# Patient Record
Sex: Female | Born: 1945 | Race: White | Hispanic: No | Smoking: Current every day smoker
Health system: Southern US, Community
[De-identification: ages and names within clinical notes are randomized; demographics above are authoritative.]

## PROBLEM LIST (undated history)

## (undated) DIAGNOSIS — J45909 Unspecified asthma, uncomplicated: Secondary | ICD-10-CM

## (undated) DIAGNOSIS — K5792 Diverticulitis of intestine, part unspecified, without perforation or abscess without bleeding: Secondary | ICD-10-CM

## (undated) DIAGNOSIS — E039 Hypothyroidism, unspecified: Secondary | ICD-10-CM

## (undated) DIAGNOSIS — R519 Headache, unspecified: Secondary | ICD-10-CM

## (undated) DIAGNOSIS — F329 Major depressive disorder, single episode, unspecified: Secondary | ICD-10-CM

## (undated) DIAGNOSIS — M1990 Unspecified osteoarthritis, unspecified site: Secondary | ICD-10-CM

## (undated) DIAGNOSIS — C801 Malignant (primary) neoplasm, unspecified: Secondary | ICD-10-CM

## (undated) DIAGNOSIS — R51 Headache: Secondary | ICD-10-CM

## (undated) DIAGNOSIS — I10 Essential (primary) hypertension: Secondary | ICD-10-CM

## (undated) DIAGNOSIS — F32A Depression, unspecified: Secondary | ICD-10-CM

## (undated) HISTORY — PX: TONSILLECTOMY: SUR1361

---

## 1999-04-19 ENCOUNTER — Other Ambulatory Visit: Admission: RE | Admit: 1999-04-19 | Discharge: 1999-04-19 | Payer: Self-pay | Admitting: *Deleted

## 2000-07-01 ENCOUNTER — Other Ambulatory Visit: Admission: RE | Admit: 2000-07-01 | Discharge: 2000-07-01 | Payer: Self-pay | Admitting: *Deleted

## 2001-07-02 ENCOUNTER — Other Ambulatory Visit: Admission: RE | Admit: 2001-07-02 | Discharge: 2001-07-02 | Payer: Self-pay | Admitting: *Deleted

## 2002-11-09 ENCOUNTER — Other Ambulatory Visit: Admission: RE | Admit: 2002-11-09 | Discharge: 2002-11-09 | Payer: Self-pay | Admitting: *Deleted

## 2004-01-17 ENCOUNTER — Other Ambulatory Visit: Admission: RE | Admit: 2004-01-17 | Discharge: 2004-01-17 | Payer: Self-pay | Admitting: *Deleted

## 2005-03-23 ENCOUNTER — Other Ambulatory Visit: Admission: RE | Admit: 2005-03-23 | Discharge: 2005-03-23 | Payer: Self-pay | Admitting: *Deleted

## 2005-08-03 ENCOUNTER — Ambulatory Visit (HOSPITAL_COMMUNITY): Admission: RE | Admit: 2005-08-03 | Discharge: 2005-08-03 | Payer: Self-pay | Admitting: Family Medicine

## 2005-08-03 ENCOUNTER — Inpatient Hospital Stay (HOSPITAL_COMMUNITY): Admission: EM | Admit: 2005-08-03 | Discharge: 2005-08-12 | Payer: Self-pay | Admitting: Emergency Medicine

## 2005-08-13 HISTORY — PX: ABDOMINAL PERINEAL BOWEL RESECTION: SHX1111

## 2005-08-14 ENCOUNTER — Ambulatory Visit (HOSPITAL_COMMUNITY): Admission: RE | Admit: 2005-08-14 | Discharge: 2005-08-14 | Payer: Self-pay | Admitting: *Deleted

## 2005-08-24 ENCOUNTER — Encounter: Admission: RE | Admit: 2005-08-24 | Discharge: 2005-08-24 | Payer: Self-pay

## 2005-11-15 ENCOUNTER — Ambulatory Visit (HOSPITAL_COMMUNITY): Admission: RE | Admit: 2005-11-15 | Discharge: 2005-11-15 | Payer: Self-pay

## 2006-06-04 ENCOUNTER — Encounter: Admission: RE | Admit: 2006-06-04 | Discharge: 2006-06-04 | Payer: Self-pay

## 2006-06-14 ENCOUNTER — Inpatient Hospital Stay (HOSPITAL_COMMUNITY): Admission: RE | Admit: 2006-06-14 | Discharge: 2006-06-19 | Payer: Self-pay

## 2006-06-14 ENCOUNTER — Encounter (INDEPENDENT_AMBULATORY_CARE_PROVIDER_SITE_OTHER): Payer: Self-pay | Admitting: Specialist

## 2006-08-13 HISTORY — PX: INCISIONAL HERNIA REPAIR: SHX193

## 2006-12-09 ENCOUNTER — Other Ambulatory Visit: Admission: RE | Admit: 2006-12-09 | Discharge: 2006-12-09 | Payer: Self-pay | Admitting: Obstetrics and Gynecology

## 2007-12-10 ENCOUNTER — Other Ambulatory Visit: Admission: RE | Admit: 2007-12-10 | Discharge: 2007-12-10 | Payer: Self-pay | Admitting: Obstetrics and Gynecology

## 2008-03-05 ENCOUNTER — Inpatient Hospital Stay (HOSPITAL_COMMUNITY): Admission: RE | Admit: 2008-03-05 | Discharge: 2008-03-07 | Payer: Self-pay | Admitting: Orthopedic Surgery

## 2009-08-25 ENCOUNTER — Other Ambulatory Visit: Admission: RE | Admit: 2009-08-25 | Discharge: 2009-08-25 | Payer: Self-pay | Admitting: Family Medicine

## 2010-08-13 DIAGNOSIS — C801 Malignant (primary) neoplasm, unspecified: Secondary | ICD-10-CM

## 2010-08-13 HISTORY — DX: Malignant (primary) neoplasm, unspecified: C80.1

## 2010-12-26 NOTE — Op Note (Signed)
NAME:  Darlene Sanders, Darlene Sanders                ACCOUNT NO.:  [id number]   MEDICAL RECORD NO.:  [id number]          PATIENT TYPE:  INP   LOCATION:  0004                         FACILITY:  [facility]   PHYSICIAN:  Wilmon Arms. Corliss Skains, M.D. DATE OF BIRTH:  [birth year]   DATE OF PROCEDURE:  03/05/2008  DATE OF DISCHARGE:                               OPERATIVE REPORT   PREOPERATIVE DIAGNOSIS:  Ventral incisional hernia   POSTOPERATIVE DIAGNOSIS:  Ventral incisional hernias x3.   SURGEON:  Wilmon Arms. Corliss Skains, MD, FACS   ANESTHESIA:  General.   INDICATIONS:  The patient is a 65 year old female who presents with  clinical bulge at the upper end of her midline incision.  She is status  post a sigmoid colectomy for diverticulitis in 2007 by Dr. Marcy Panning.  She developed a bulge within the first year, and this has enlarged and  become uncomfortable.  She denies any obstructive symptoms.  She  presents now for surgical repair.   DESCRIPTION OF PROCEDURE:  The patient was brought to the operating  room, placed in a supine position on the operating room table.  After an  adequate level of general anesthesia was obtained, a Foley catheter was  placed under sterile technique.  Graft was prepped with Betadine and  draped in sterile fashion.  A time-out was taken to ensure the proper  patient and proper procedure.  An 11 mm incision was made in the left  upper quadrant just below the costal margin.  The Fios trocar was used  to cannulate the peritoneal cavity and insufflate CO2 maintaining a  maximum pressure of 15 mmHg.  The trocar was advanced into the  peritoneal cavity, and we noticed a lot of omental adhesions as well as  some bowel heading up into an upper midline hernia defect.  There were  some flimsy adhesions down inside of the pelvis.  Two 5 mm ports placed  on the left side.  We took down some of these pelvic adhesions.  Then  with a combination of manual reduction and traction as well as judicious  use  of cautery, we were able to reduce all the contents out of the  hernia sac.  We actually identified 2 hernia sacs which were immediately  adjacent to each other in the epigastrium.  This upper hernia defect  extended right up to the edge of the falciform ligament.  We took the  falciform ligament back with cautery for several centimeters.  We then  examined the remainder of the abdominal wall.  Another hernia defect was  noted at the umbilicus.  No contents were noted in this hernia sac.  The  umbilical hernia defect measured about 3 cm.  The accumulative diameter  of the 2 epigastric hernias was about 5 cm.  However, when I measured  the ends of each hernia defect and added at least 3 cm in all directions  for mesh overlap, we needed a 25 cm long piece of mesh.  I took a 20 x  25 cm piece of mesh and cut into a 50 x 25 cm piece.  This was rolled up  after placing 6 stay sutures of 0 Prolene.  We inserted it into the  peritoneal cavity, unfurled it.  The stay sutures were pulled up through  the abdominal wall with the Endoclose device.  We tied down the stay  sutures.  A Protacs were then used to tack down the edges of the mesh.  This provided good coverage circumferentially for all the hernia  defects.  No bleeding was noted.  The mesh did not appear to be under  any undue tension.  We did place two 5 mm right-sided trocars to provide  access for a circumferential Protex.  We then removed the 11 mm Fios  trocar and closed the fascial defect with a 0 Vicryl suture.  Pneumoperitoneum was then  released as we removed the remainder of the trocars.  The 11 mm incision  was closed with a couple of deep 3-0 Vicryls.  Dermabond was used to  close the skin.  Foley catheter was removed.  The patient was then  extubated and brought to recovery in stable condition.  All sponge,  instrument, and needle counts were correct.      Wilmon Arms. Tsuei, M.D.  Electronically Signed     MKT/MEDQ  D:   03/05/2008  T:  03/05/2008  Job:  [id number]
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
## 2010-12-29 NOTE — Discharge Summary (Signed)
Darlene Sanders, Darlene Sanders                ACCOUNT NO.:  [id number]   MEDICAL RECORD NO.:  [id number]          PATIENT TYPE:  INP   LOCATION:  1616                         FACILITY:  [facility]   PHYSICIAN:  Lebron Conners, M.D.   DATE OF BIRTH:  [birth year]   DATE OF ADMISSION:  08/03/2005  DATE OF DISCHARGE:  08/12/2005                                 DISCHARGE SUMMARY   HISTORY OF PRESENT ILLNESS:  A 65 year old, healthy, white female with lower  abdominal pain for about 2 weeks.  On the day of admission, she was seen by  Dr. Gweneth Dimitri who obtained a CT scan of the abdomen with findings of  acute diverticulitis of the sigmoid colon with an abscess. The patient had a  white count of 20,000 and a fever.  She was referred to me for care and I  admitted her to the hospital.  Pertinent points of the past medical history  are occasional symptoms with reflux, high blood pressure which is well-  controlled, penicillin causing a rash, fluorescein dye causing fainting,  nausea with codeine.   MEDICATIONS:  Lisinopril, Pravachol, Synthroid, Celexa.   PAST SURGICAL HISTORY:  She has had a tonsillectomy.   PHYSICAL EXAMINATION:  Physical exam was significant for tenderness in the  left lower quadrant of the abdomen.   HOSPITAL COURSE:  The patient was admitted and placed on broad-spectrum  antibiotics.  The white count, exam and vitals were followed.  On August 04, 2005, interventional radiology did a CT-guided drainage of left lower  quadrant diverticular abscess.  The patient slowly improved, but continued  to have some leukocytosis and fever.  The patient improved, but she  continued to have some difficulty with fever and tenderness in the left  lower quadrant.  A second abscess drain was placed and the previously placed  drain was removed and that resulted in resolution of symptoms, leukocytosis  and fever.  The patient was discharged with drain intact on August 12, 2005, with arrangements  made for followup CT scan and possible drainage  removal next week.   DISCHARGE MEDICATIONS:  She was sent home on Cipro and Flagyl for continued  oral therapy of the diverticulitis.   DISCHARGE DIAGNOSES:  1.  Diverticulitis of the sigmoid colon with perforation and abscess,      improved.  2.  Hypertension.  3.  Hyperlipidemia.   PROCEDURE:  Percutaneous abscess drainage (twice).   DISCHARGE CONDITION:  Improved.      Lebron Conners, M.D.  Electronically Signed     WB/MEDQ  D:  08/20/2005  T:  08/21/2005  Job:  [id number]   cc:   Pam Drown, M.D.  Fax: [phone]

## 2010-12-29 NOTE — Discharge Summary (Signed)
Darlene Sanders, Darlene Sanders NO.:  [id number]   MEDICAL RECORD NO.:  [id number]          PATIENT TYPE:  INP   LOCATION:  1619                         FACILITY:  [facility]   PHYSICIAN:  Lebron Conners, M.D.   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  It was  minimally uncomfortable for the patient until just recently but she had been  having some pain and fever and had decided to go ahead with elective sigmoid  colectomy.  She took a bowel prep on an outpatient basis and was admitted to  the hospital for elective colectomy.   Past history is significant for increased blood pressure and cholesterol.  She has a history of hypothyroidism.   Medicines were lisinopril, Pravachol, Celexa and Synthroid.   She was allergic to PENICILLIN, CODEINE and FLUORESCEIN DYE.   She had not had any previous operations.   She does not smoke.  She drinks alcoholic beverages very moderately.   Review of systems was unremarkable.   The physical exam was unremarkable except for a draining sinus and left  lower quadrant.   HOSPITAL COURSE:  On the day of admission the patient underwent a sigmoid  colectomy with anastomosis.  The operation went well and the pathology of  the specimen showed benign diverticular change.  The fistula was cured.  Postoperatively she had a routine recovery, recovering good GI function and  being comfortable on oral pain medicine by the fifth postoperative day.  She  was discharged on that date with removal of the staples having taken place.  Steri-Strips were applied.  She was to see me at the office in two to three  weeks.   DIAGNOSIS:  1.  Diagnosis diverticulitis of sigmoid colon.  2. Hyperlipidemia/  3. Hypertension.   OPERATION:  Sigmoid colectomy with anastomosis.   DISCHARGE CONDITION:  Improved.      Lebron Conners, M.D.  Electronically Signed     WB/MEDQ  D:  07/05/2006  T:  07/05/2006  Job:  [id number]

## 2010-12-29 NOTE — Op Note (Signed)
Darlene Sanders, Darlene Sanders                ACCOUNT NO.:  [id number]   MEDICAL RECORD NO.:  [id number]          PATIENT TYPE:  INP   LOCATION:  1619                         FACILITY:  [facility]   PHYSICIAN:  Lebron Conners, M.D.   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   OPERATION:  Sigmoid colectomy with anastomosis and take down of the splenic  flexure.   SURGEON:  Lebron Conners, M.D.   ANESTHESIA:  General.   BLOOD LOSS:  Approximately 200 mL.   COMPLICATIONS:  None.   SPECIMEN:  Sigmoid colon.   PROCEDURE:  After the patient was monitored and asleep and had routine  preparation and draping of the abdomen and perineum, and had a Foley  catheter; I made a lower midline incision between the umbilicus and pubis  and eventually length of that through just above the umbilicus. Entered the  peritoneal cavity carefully and noted that the small bowel was normal,  except for one loop which was stuck to an inflammatory mass in the sigmoid  colon.  I divided the adhesions there.  There was a great deal of fibrosis  and induration around the sigmoid colon; but the colon proximal and distal  to it was nice and soft.  Scattered diverticula were noted all along the  descending and sigmoid colon.  I did not see any diverticula distally.  I  dissected from lateral to medial, taking down adhesions and cutting through  fibrosis; identified the white line of Toldt, and then incised that along  the descending colon and sigmoid colon.  With some difficulty I mobilized  the sigmoid colon.  I worked distally and freed up the bowel there, and  decided that an intraperitoneal anastomosis would be best.  I divided the  distal bowel with  a cutting stapler.  It appeared to need slight  reinforcement at a place or two, where it may have been somewhat crushed.  I  reinforced that with 2-0 silk stitches.  The bowel appeared healthy.  I then  further reflected the colon medially.  I identified the ovarian vessels and  the ureter, and spared then from harm.  I worked further medially and pulled  down on the bowel, and found that I did not have nearly enough length of  healthy bowel proximally to reach down into the pelvis for anastomosis.  I  then incised the peritoneum lateral to the descending colon further up, and  took down the adhesions around the splenic flexure; worked medially through  the gastrocolic omentum until I had good mobility of all of the splenic  flexure.  I then was able to bring the colon down into the pelvis without  tension, and it lay there without retracting.  At that point I worked up  through the mesentery to a healthy point proximal to the inflammatory  process.  I then divided the bowel with a cutting stapler at that point, and  it made a nice division that appeared healthy.  I then laid proximal and  distal portions of the bowel side-by-side.  There was not too much room in  the pelvis, so I decided not to perform a side-to-side anastomosis with the  bowel ends approximated, but rather to place the end of the proximal bowel  farther down into the pelvis and perform a side-to-side anastomosis in that  fashion.  I secured the bowel together with suture and made sure that  antimesenteric sides met each other.  I made holes in the antimesenteric  portions of the bowel and fired a cutting stapler into the bowel, creating a  large anastomosis.  I closed the remaining defect with interrupted 2-0 and 3-  0 silk stitches, after assuring that there was no bleeding inside.  After  completing the anastomosis, I did a rigid proctoscopy and noted that the  bowel looked healthy inside.  I could not easily see all  the way up to the  anastomosis.  I inflated the bowel with air until good tension developed,  and there was no evident leak of air at the anastomosis or at either end of  the bowel.  I then removed the irrigant from the pelvis and irrigated the  left gutter.  Hemostasis was excellent.  The sponge, needle and instrument  counts were correct.  I closed the fascia with running #1 PDS and closed the  skin with staples, after irrigating the subcutaneous tissues.  The patient  tolerated the operation well.      Lebron Conners, M.D.  Electronically Signed     WB/MEDQ  D:  06/14/2006  T:  06/14/2006  Job:  [id number]

## 2011-05-11 LAB — BASIC METABOLIC PANEL
BUN: 9
Chloride: 104
GFR calc non Af Amer: >60
Potassium: 4.5
Sodium: 141

## 2011-05-11 LAB — DIFFERENTIAL
Eosinophils Absolute: 0.2
Eosinophils Relative: 2
Lymphocytes Relative: 34
Lymphs Abs: 3.2
Monocytes Absolute: 0.7
Monocytes Relative: 8

## 2011-05-11 LAB — CBC
HCT: 43.8
Hemoglobin: 15
MCV: 90.1
RBC: 4.86
WBC: 9.6

## 2012-05-16 ENCOUNTER — Other Ambulatory Visit (HOSPITAL_COMMUNITY): Payer: Self-pay | Admitting: Orthopedic Surgery

## 2012-05-16 DIAGNOSIS — M25551 Pain in right hip: Secondary | ICD-10-CM

## 2012-05-20 ENCOUNTER — Ambulatory Visit (HOSPITAL_COMMUNITY)
Admission: RE | Admit: 2012-05-20 | Discharge: 2012-05-20 | Disposition: A | Discharge status: Home / Self Care | Payer: 59 | Source: Ambulatory Visit | Attending: Orthopedic Surgery | Admitting: Orthopedic Surgery

## 2012-05-20 DIAGNOSIS — M169 Osteoarthritis of hip, unspecified: Secondary | ICD-10-CM | POA: Insufficient documentation

## 2012-05-20 DIAGNOSIS — M1610 Unilateral primary osteoarthritis, unspecified hip: Secondary | ICD-10-CM | POA: Insufficient documentation

## 2012-05-20 DIAGNOSIS — M25559 Pain in unspecified hip: Secondary | ICD-10-CM | POA: Insufficient documentation

## 2012-05-20 DIAGNOSIS — M25551 Pain in right hip: Secondary | ICD-10-CM

## 2012-05-20 MED ORDER — LORAZEPAM 2 MG/ML IJ SOLN
INTRAMUSCULAR | Status: AC
  Administered 2012-05-20: 1 mg
  Filled 2012-05-20: qty 1

## 2012-05-20 MED ORDER — LORAZEPAM 2 MG/ML IJ SOLN
2.0000 mg | Freq: Once | INTRAMUSCULAR | Status: DC
  Filled 2012-05-20: qty 1

## 2012-05-20 NOTE — ED Notes (Signed)
MRI scan start

## 2012-05-20 NOTE — ED Notes (Signed)
Per P.Orvan Falconer PA after speaking with pt.  Scan will be attempted without moderate sedation at this time.  Pt to be given IV ativan 1mg  now and may repeat with 1 mg prn if VSS

## 2012-05-20 NOTE — Progress Notes (Signed)
Patient presents today for MRI examination of her hip.  She had initially requested moderate sedation for claustrophobia.  Once I spoke with patient informing her that her head would not be within the scanner itself - she felt that ativan would be a good alternative for her and of course it was verbalized the decreased risks if moderate sedation was not used.    PE:  Alert and oriented x 3, non-distressed. CV: RRR without murmurs, rubs or gallops Resp: CTA bilaterally MS:  No LE edema Airway : 2  Orders placed for 2mg  IV ativan.  Will start with 1.0 mg and repeat dose as needed.  I have requested oxygen saturations to be monitored along with BP's before administering second dose.  Patient agreeable to proceed with the IV ativan instead of moderate sedation.

## 2012-05-20 NOTE — ED Notes (Signed)
Patient is resting comfortably. 

## 2012-05-20 NOTE — ED Notes (Signed)
MRI completed.

## 2012-05-20 NOTE — ED Notes (Signed)
Pt remained stable throughout MRI.  VSS.

## 2014-11-18 ENCOUNTER — Other Ambulatory Visit: Payer: Self-pay | Admitting: Family Medicine

## 2014-11-18 DIAGNOSIS — E041 Nontoxic single thyroid nodule: Secondary | ICD-10-CM

## 2014-11-23 ENCOUNTER — Ambulatory Visit
Admission: RE | Admit: 2014-11-23 | Discharge: 2014-11-23 | Disposition: A | Discharge status: Home / Self Care | Payer: 59 | Source: Ambulatory Visit | Attending: Family Medicine | Admitting: Family Medicine

## 2014-11-23 DIAGNOSIS — E041 Nontoxic single thyroid nodule: Secondary | ICD-10-CM

## 2015-08-14 HISTORY — PX: COLONOSCOPY: SHX174

## 2016-06-27 ENCOUNTER — Ambulatory Visit (INDEPENDENT_AMBULATORY_CARE_PROVIDER_SITE_OTHER): Payer: 59

## 2016-06-27 ENCOUNTER — Ambulatory Visit (INDEPENDENT_AMBULATORY_CARE_PROVIDER_SITE_OTHER): Payer: 59 | Admitting: Orthopaedic Surgery

## 2016-06-27 DIAGNOSIS — M1611 Unilateral primary osteoarthritis, right hip: Secondary | ICD-10-CM | POA: Diagnosis not present

## 2016-06-27 DIAGNOSIS — M25551 Pain in right hip: Secondary | ICD-10-CM

## 2016-06-27 NOTE — Progress Notes (Signed)
Office Visit Note   Patient: Darlene Sanders           Date of Birth: 1946/08/09           MRN: [id number] Visit Date: 06/27/2016              Requested by: Cari Caraway, MD [place], [place] [id number] PCP: Cari Caraway, MD   Assessment & Plan: Visit Diagnoses:  1. Pain in right hip   2. Unilateral primary osteoarthritis, right hip     Plan: At this point given her daily pain, her decreased mobility, and the negative impacts her right hip pain is had on her activities daily living and her quality of life she wishes to proceed with a total hip arthroplasty through direct anterior approach. I showed her hip model and gave her handout on hip replaced surgery. We went over her x-rays and talked in great detail about what the surgery involves including a thorough discussion of the risk and benefits of surgery. Given the failure of all conservative treatment options and findings minimal for many years now she does wish proceed with the surgery in the near future.  Follow-Up Instructions: Return for 2 weeks post-op.   Orders:  Orders Placed This Encounter  Procedures  . XR HIP UNILAT W OR W/O PELVIS 1V RIGHT   No orders of the defined types were placed in this encounter.     Procedures: No procedures performed   Clinical Data: No additional findings.   Subjective: Chief Complaint  Patient presents with  . Right Hip - Pain    Patient states she has had pain for several years, she states she has been told she needs hip replacement from [facility].    Her pain is now in her right hip and right groin and is detrimentally affected her activity is daily living, her quality of life and her mobility. Is been going on for many years now and she had x-rays in the past that showed significant arthritis in her hip. She has tried and failed all forms conservative treatment. Her pain can be 10 out of 10 at times and is activity related. It does occasionally wake  her up at night. She has been to another orthopedic practice in town who recommended hip replacement surgery but she came to me since I have taken care of neighbors and friends of hers before HPI  Review of Systems She denies any headache, chest pain, shortness of breath, fever, chills, nausea, vomiting.  Objective: Vital Signs: There were no vitals taken for this visit.  Physical Exam  Constitutional: She is oriented to person, place, and time. She appears well-developed and well-nourished.  HENT:  Head: Normocephalic and atraumatic.  Eyes: EOM are normal. Pupils are equal, round, and reactive to light.  Neck: Normal range of motion. Neck supple.  Cardiovascular: Normal rate and regular rhythm.  Exam reveals friction rub.   Pulmonary/Chest: Effort normal and breath sounds normal.  Abdominal: Soft. Bowel sounds are normal.  Musculoskeletal:       Right hip: She exhibits decreased range of motion, decreased strength, tenderness and bony tenderness.  Neurological: She is alert and oriented to person, place, and time.  Skin: Skin is warm and dry.    Ortho Exam  Specialty Comments:  No specialty comments available.  Imaging: Xr Hip Unilat W Or W/o Pelvis 1v Right  Result Date: 06/27/2016 An AP pelvis and lateral of her right hip show severe end-stage osteoarthritis of  the right hip. There are sclerotic changes in the femoral head and acetabulum. There is joint space narrowing, periarticular osteophytes, and flattening of the femoral head. No evidence of acute injury    PMFS History: There are no active problems to display for this patient.  No past medical history on file.  No family history on file.  No past surgical history on file. Social History   Occupational History  . Not on file.   Social History Main Topics  . Smoking status: Not on file  . Smokeless tobacco: Not on file  . Alcohol use Not on file  . Drug use: Unknown  . Sexual activity: Not on file

## 2016-07-10 ENCOUNTER — Telehealth (INDEPENDENT_AMBULATORY_CARE_PROVIDER_SITE_OTHER): Payer: Self-pay | Admitting: Orthopaedic Surgery

## 2016-07-10 NOTE — Telephone Encounter (Signed)
Ms. Darlene Sanders called saying she's scheduled for surgery in January. She's wondering if she can have surgery in December instead. Please give her a phone call regarding this.  Pt's ph# [phone] Thank you.

## 2016-07-10 NOTE — Telephone Encounter (Signed)
See below

## 2016-07-19 NOTE — Telephone Encounter (Signed)
I called patient and left voice mail for return call. °

## 2016-08-10 ENCOUNTER — Other Ambulatory Visit (INDEPENDENT_AMBULATORY_CARE_PROVIDER_SITE_OTHER): Payer: Self-pay | Admitting: Physician Assistant

## 2016-08-10 NOTE — Progress Notes (Signed)
Scheduling pre op--pleae [facility] SURGICAL ORDERS IN EPIC  Thanks

## 2016-08-17 ENCOUNTER — Other Ambulatory Visit (HOSPITAL_COMMUNITY): Payer: Self-pay | Admitting: Emergency Medicine

## 2016-08-20 ENCOUNTER — Encounter (HOSPITAL_COMMUNITY)
Admission: RE | Admit: 2016-08-20 | Discharge: 2016-08-20 | Disposition: A | Discharge status: Home / Self Care | Payer: Medicare Other | Source: Ambulatory Visit | Attending: Orthopaedic Surgery | Admitting: Orthopaedic Surgery

## 2016-08-20 ENCOUNTER — Encounter (HOSPITAL_COMMUNITY): Payer: Self-pay | Admitting: Emergency Medicine

## 2016-08-20 DIAGNOSIS — F329 Major depressive disorder, single episode, unspecified: Secondary | ICD-10-CM | POA: Diagnosis not present

## 2016-08-20 DIAGNOSIS — I10 Essential (primary) hypertension: Secondary | ICD-10-CM | POA: Insufficient documentation

## 2016-08-20 DIAGNOSIS — E039 Hypothyroidism, unspecified: Secondary | ICD-10-CM | POA: Insufficient documentation

## 2016-08-20 DIAGNOSIS — Z880 Allergy status to penicillin: Secondary | ICD-10-CM | POA: Diagnosis not present

## 2016-08-20 DIAGNOSIS — I517 Cardiomegaly: Secondary | ICD-10-CM | POA: Diagnosis not present

## 2016-08-20 DIAGNOSIS — R51 Headache: Secondary | ICD-10-CM | POA: Insufficient documentation

## 2016-08-20 DIAGNOSIS — Z85820 Personal history of malignant melanoma of skin: Secondary | ICD-10-CM | POA: Insufficient documentation

## 2016-08-20 DIAGNOSIS — M1990 Unspecified osteoarthritis, unspecified site: Secondary | ICD-10-CM | POA: Insufficient documentation

## 2016-08-20 DIAGNOSIS — Z01812 Encounter for preprocedural laboratory examination: Secondary | ICD-10-CM | POA: Insufficient documentation

## 2016-08-20 DIAGNOSIS — K5792 Diverticulitis of intestine, part unspecified, without perforation or abscess without bleeding: Secondary | ICD-10-CM | POA: Insufficient documentation

## 2016-08-20 DIAGNOSIS — Z01810 Encounter for preprocedural cardiovascular examination: Secondary | ICD-10-CM | POA: Diagnosis not present

## 2016-08-20 HISTORY — DX: Headache, unspecified: R51.9

## 2016-08-20 HISTORY — DX: Hypothyroidism, unspecified: E03.9

## 2016-08-20 HISTORY — DX: Headache: R51

## 2016-08-20 HISTORY — DX: Unspecified osteoarthritis, unspecified site: M19.90

## 2016-08-20 HISTORY — DX: Depression, unspecified: F32.A

## 2016-08-20 HISTORY — DX: Diverticulitis of intestine, part unspecified, without perforation or abscess without bleeding: K57.92

## 2016-08-20 HISTORY — DX: Unspecified asthma, uncomplicated: J45.909

## 2016-08-20 HISTORY — DX: Major depressive disorder, single episode, unspecified: F32.9

## 2016-08-20 HISTORY — DX: Essential (primary) hypertension: I10

## 2016-08-20 HISTORY — DX: Malignant (primary) neoplasm, unspecified: C80.1

## 2016-08-20 LAB — SURGICAL PCR SCREEN
MRSA, PCR: NEGATIVE
Staphylococcus aureus: NEGATIVE

## 2016-08-20 LAB — CBC
HEMATOCRIT: 44.7 % (ref 36.0–46.0)
Hemoglobin: 14.6 g/dL (ref 12.0–15.0)
MCH: 30 pg (ref 26.0–34.0)
MCHC: 32.7 g/dL (ref 30.0–36.0)
MCV: 91.8 fL (ref 78.0–100.0)
Platelets: 201 10*3/uL (ref 150–400)
RBC: 4.87 MIL/uL (ref 3.87–5.11)
RDW: 13.7 % (ref 11.5–15.5)
WBC: 6.2 10*3/uL (ref 4.0–10.5)

## 2016-08-20 LAB — BASIC METABOLIC PANEL
Anion gap: 7 (ref 5–15)
BUN: 13 mg/dL (ref 6–20)
CHLORIDE: 104 mmol/L (ref 101–111)
CO2: 30 mmol/L (ref 22–32)
Calcium: 9.8 mg/dL (ref 8.9–10.3)
Creatinine, Ser: 0.66 mg/dL (ref 0.44–1.00)
GFR calc Af Amer: >60 mL/min (ref >60)
GFR calc non Af Amer: >60 mL/min (ref >60)
Glucose, Bld: 110 mg/dL — ABNORMAL HIGH (ref 65–99)
POTASSIUM: 4.9 mmol/L (ref 3.5–5.1)
SODIUM: 141 mmol/L (ref 135–145)

## 2016-08-20 LAB — ABO/RH: ABO/RH(D): O POS

## 2016-08-20 NOTE — Patient Instructions (Signed)
Darlene Sanders  08/20/2016   Your procedure is scheduled on: 08/24/2016  Report to [facility] Main  Entrance take [facility]  elevators to 3rd floor to  [place] at [phone].  Call this number if you have problems the morning of surgery [phone]   Remember: ONLY 1 PERSON MAY GO WITH YOU TO SHORT STAY TO GET  READY MORNING OF [place].  Do not eat food or drink liquids :After Midnight.     Take these medicines the morning of surgery with A SIP OF WATER: ceterizine(zyrtec), citalopram(celexa), mometasone(flonase) as needed, rosuvastatin(crestor), synhroid                                You may not have any metal on your body including hair pins and              piercings  Do not wear jewelry, make-up, lotions, powders or perfumes, deodorant             Do not wear nail polish.  Do not shave  48 hours prior to surgery.              Men may shave face and neck.   Do not bring valuables to the hospital. [place].  Contacts, dentures or bridgework may not be worn into surgery.  Leave suitcase in the car. After surgery it may be brought to your room.                  Please read over the following fact sheets you were given: _____________________________________________________________________             [facility] - Preparing for Surgery Before surgery, you can play an important role.  Because skin is not sterile, your skin needs to be as free of germs as possible.  You can reduce the number of germs on your skin by washing with CHG (chlorahexidine gluconate) soap before surgery.  CHG is an antiseptic cleaner which kills germs and bonds with the skin to continue killing germs even after washing. Please DO NOT use if you have an allergy to CHG or antibacterial soaps.  If your skin becomes reddened/irritated stop using the CHG and inform your nurse when you arrive at Short Stay. Do not shave (including  legs and underarms) for at least 48 hours prior to the first CHG shower.  You may shave your face/neck. Please follow these instructions carefully:  1.  Shower with CHG Soap the night before surgery and the  morning of Surgery.  2.  If you choose to wash your hair, wash your hair first as usual with your  normal  shampoo.  3.  After you shampoo, rinse your hair and body thoroughly to remove the  shampoo.                           4.  Use CHG as you would any other liquid soap.  You can apply chg directly  to the skin and wash                       Gently with a scrungie or clean washcloth.  5.  Apply the CHG Soap to your body ONLY FROM THE NECK DOWN.   Do not use on face/ open                           Wound or open sores. Avoid contact with eyes, ears mouth and genitals (private parts).                       Wash face,  Genitals (private parts) with your normal soap.             6.  Wash thoroughly, paying special attention to the area where your surgery  will be performed.  7.  Thoroughly rinse your body with warm water from the neck down.  8.  DO NOT shower/wash with your normal soap after using and rinsing off  the CHG Soap.                9.  Pat yourself dry with a clean towel.            10.  Wear clean pajamas.            11.  Place clean sheets on your bed the night of your first shower and do not  sleep with pets. Day of Surgery : Do not apply any lotions/deodorants the morning of surgery.  Please wear clean clothes to the hospital/surgery center.  FAILURE TO FOLLOW THESE INSTRUCTIONS MAY RESULT IN THE CANCELLATION OF YOUR SURGERY PATIENT SIGNATURE_________________________________  NURSE SIGNATURE__________________________________  ________________________________________________________________________   Adam Phenix  An incentive spirometer is a tool that can help keep your lungs clear and active. This tool measures how well you are filling your lungs with each breath.  Taking long deep breaths may help reverse or decrease the chance of developing breathing (pulmonary) problems (especially infection) following:  A long period of time when you are unable to move or be active. BEFORE THE PROCEDURE   If the spirometer includes an indicator to show your best effort, your nurse or respiratory therapist will set it to a desired goal.  If possible, sit up straight or lean slightly forward. Try not to slouch.  Hold the incentive spirometer in an upright position. INSTRUCTIONS FOR USE  1. Sit on the edge of your bed if possible, or sit up as far as you can in bed or on a chair. 2. Hold the incentive spirometer in an upright position. 3. Breathe out normally. 4. Place the mouthpiece in your mouth and seal your lips tightly around it. 5. Breathe in slowly and as deeply as possible, raising the piston or the ball toward the top of the column. 6. Hold your breath for 3-5 seconds or for as long as possible. Allow the piston or ball to fall to the bottom of the column. 7. Remove the mouthpiece from your mouth and breathe out normally. 8. Rest for a few seconds and repeat Steps 1 through 7 at least 10 times every 1-2 hours when you are awake. Take your time and take a few normal breaths between deep breaths. 9. The spirometer may include an indicator to show your best effort. Use the indicator as a goal to work toward during each repetition. 10. After each set of 10 deep breaths, practice coughing to be sure your lungs are clear. If you have an incision (the cut made at the time of surgery), support your incision when coughing by placing a pillow or  rolled up towels firmly against it. Once you are able to get out of bed, walk around indoors and cough well. You may stop using the incentive spirometer when instructed by your caregiver.  RISKS AND COMPLICATIONS  Take your time so you do not get dizzy or light-headed.  If you are in pain, you may need to take or ask for pain  medication before doing incentive spirometry. It is harder to take a deep breath if you are having pain. AFTER USE  Rest and breathe slowly and easily.  It can be helpful to keep track of a log of your progress. Your caregiver can provide you with a simple table to help with this. If you are using the spirometer at home, follow these instructions: [place] IF:   You are having difficultly using the spirometer.  You have trouble using the spirometer as often as instructed.  Your pain medication is not giving enough relief while using the spirometer.  You develop fever of 100.5 F (38.1 C) or higher. SEEK IMMEDIATE MEDICAL CARE IF:   You cough up bloody sputum that had not been present before.  You develop fever of 102 F (38.9 C) or greater.  You develop worsening pain at or near the incision site. MAKE SURE YOU:   Understand these instructions.  Will watch your condition.  Will get help right away if you are not doing well or get worse. Document Released: 12/10/2006 Document Revised: 10/22/2011 Document Reviewed: 02/10/2007 ExitCare Patient Information 2014 ExitCare, [place].   ________________________________________________________________________  WHAT IS A BLOOD TRANSFUSION? Blood Transfusion Information  A transfusion is the replacement of blood or some of its parts. Blood is made up of multiple cells which provide different functions.  Red blood cells carry oxygen and are used for blood loss replacement.  White blood cells fight against infection.  Platelets control bleeding.  Plasma helps clot blood.  Other blood products are available for specialized needs, such as hemophilia or other clotting disorders. BEFORE THE TRANSFUSION  Who gives blood for transfusions?   Healthy volunteers who are fully evaluated to make sure their blood is safe. This is blood bank blood. Transfusion therapy is the safest it has ever been in the practice of medicine.  Before blood is taken from a donor, a complete history is taken to make sure that person has no history of diseases nor engages in risky social behavior (examples are intravenous drug use or sexual activity with multiple partners). The donor's travel history is screened to minimize risk of transmitting infections, such as malaria. The donated blood is tested for signs of infectious diseases, such as HIV and hepatitis. The blood is then tested to be sure it is compatible with you in order to minimize the chance of a transfusion reaction. If you or a relative donates blood, this is often done in anticipation of surgery and is not appropriate for emergency situations. It takes many days to process the donated blood. RISKS AND COMPLICATIONS Although transfusion therapy is very safe and saves many lives, the main dangers of transfusion include:   Getting an infectious disease.  Developing a transfusion reaction. This is an allergic reaction to something in the blood you were given. Every precaution is taken to prevent this. The decision to have a blood transfusion has been considered carefully by your caregiver before blood is given. Blood is not given unless the benefits outweigh the risks. AFTER THE TRANSFUSION  Right after receiving a blood transfusion, you will usually  feel much better and more energetic. This is especially true if your red blood cells have gotten low (anemic). The transfusion raises the level of the red blood cells which carry oxygen, and this usually causes an energy increase.  The nurse administering the transfusion will monitor you carefully for complications. HOME CARE INSTRUCTIONS  No special instructions are needed after a transfusion. You may find your energy is better. Speak with your caregiver about any limitations on activity for underlying diseases you may have. SEEK MEDICAL CARE IF:   Your condition is not improving after your transfusion.  You develop redness or  irritation at the intravenous (IV) site. SEEK IMMEDIATE MEDICAL CARE IF:  Any of the following symptoms occur over the next 12 hours:  Shaking chills.  You have a temperature by mouth above 102 F (38.9 C), not controlled by medicine.  Chest, back, or muscle pain.  People around you feel you are not acting correctly or are confused.  Shortness of breath or difficulty breathing.  Dizziness and fainting.  You get a rash or develop hives.  You have a decrease in urine output.  Your urine turns a dark color or changes to pink, red, or brown. Any of the following symptoms occur over the next 10 days:  You have a temperature by mouth above 102 F (38.9 C), not controlled by medicine.  Shortness of breath.  Weakness after normal activity.  The white part of the eye turns yellow (jaundice).  You have a decrease in the amount of urine or are urinating less often.  Your urine turns a dark color or changes to pink, red, or brown. Document Released: 07/27/2000 Document Revised: 10/22/2011 Document Reviewed: 03/15/2008 [facility] Patient Information 2014 [place], [place].  _______________________________________________________________________

## 2016-08-24 ENCOUNTER — Inpatient Hospital Stay (HOSPITAL_COMMUNITY)
Admission: RE | Admit: 2016-08-24 | Discharge: 2016-08-25 | DRG: 470 | Disposition: A | Discharge status: Home / Self Care | Payer: Medicare Other | Source: Ambulatory Visit | Attending: Orthopaedic Surgery | Admitting: Orthopaedic Surgery

## 2016-08-24 ENCOUNTER — Encounter (HOSPITAL_COMMUNITY)
Admission: RE | Disposition: A | Discharge status: Home / Self Care | Payer: Self-pay | Source: Ambulatory Visit | Attending: Orthopaedic Surgery

## 2016-08-24 ENCOUNTER — Encounter (HOSPITAL_COMMUNITY): Payer: Self-pay | Admitting: *Deleted

## 2016-08-24 ENCOUNTER — Inpatient Hospital Stay (HOSPITAL_COMMUNITY): Payer: Medicare Other | Admitting: Certified Registered Nurse Anesthetist

## 2016-08-24 ENCOUNTER — Inpatient Hospital Stay (HOSPITAL_COMMUNITY): Payer: Medicare Other

## 2016-08-24 DIAGNOSIS — I10 Essential (primary) hypertension: Secondary | ICD-10-CM | POA: Diagnosis not present

## 2016-08-24 DIAGNOSIS — Z91041 Radiographic dye allergy status: Secondary | ICD-10-CM

## 2016-08-24 DIAGNOSIS — J45909 Unspecified asthma, uncomplicated: Secondary | ICD-10-CM | POA: Diagnosis present

## 2016-08-24 DIAGNOSIS — E039 Hypothyroidism, unspecified: Secondary | ICD-10-CM | POA: Diagnosis not present

## 2016-08-24 DIAGNOSIS — Z419 Encounter for procedure for purposes other than remedying health state, unspecified: Secondary | ICD-10-CM

## 2016-08-24 DIAGNOSIS — Z880 Allergy status to penicillin: Secondary | ICD-10-CM | POA: Diagnosis not present

## 2016-08-24 DIAGNOSIS — M1611 Unilateral primary osteoarthritis, right hip: Secondary | ICD-10-CM | POA: Diagnosis not present

## 2016-08-24 DIAGNOSIS — F329 Major depressive disorder, single episode, unspecified: Secondary | ICD-10-CM | POA: Diagnosis not present

## 2016-08-24 DIAGNOSIS — Z471 Aftercare following joint replacement surgery: Secondary | ICD-10-CM | POA: Diagnosis not present

## 2016-08-24 DIAGNOSIS — Z885 Allergy status to narcotic agent status: Secondary | ICD-10-CM

## 2016-08-24 DIAGNOSIS — F17210 Nicotine dependence, cigarettes, uncomplicated: Secondary | ICD-10-CM | POA: Diagnosis present

## 2016-08-24 DIAGNOSIS — Z96641 Presence of right artificial hip joint: Secondary | ICD-10-CM | POA: Diagnosis not present

## 2016-08-24 DIAGNOSIS — Z85820 Personal history of malignant melanoma of skin: Secondary | ICD-10-CM

## 2016-08-24 HISTORY — PX: TOTAL HIP ARTHROPLASTY: SHX124

## 2016-08-24 LAB — TYPE AND SCREEN
ABO/RH(D): O POS
Antibody Screen: NEGATIVE

## 2016-08-24 SURGERY — ARTHROPLASTY, HIP, TOTAL, ANTERIOR APPROACH
Anesthesia: Spinal | Site: Hip | Laterality: Right

## 2016-08-24 MED ORDER — ONDANSETRON HCL 4 MG/2ML IJ SOLN
INTRAMUSCULAR | Status: AC
  Filled 2016-08-24: qty 2

## 2016-08-24 MED ORDER — FENTANYL CITRATE (PF) 100 MCG/2ML IJ SOLN: 25.0000 ug | INTRAMUSCULAR | Status: DC | PRN

## 2016-08-24 MED ORDER — FENTANYL CITRATE (PF) 100 MCG/2ML IJ SOLN
INTRAMUSCULAR | Status: AC
  Filled 2016-08-24: qty 2

## 2016-08-24 MED ORDER — DEXAMETHASONE SODIUM PHOSPHATE 10 MG/ML IJ SOLN
INTRAMUSCULAR | Status: AC
  Filled 2016-08-24: qty 1

## 2016-08-24 MED ORDER — KETOROLAC TROMETHAMINE 15 MG/ML IJ SOLN
7.5000 mg | Freq: Four times a day (QID) | INTRAMUSCULAR | Status: AC
  Administered 2016-08-24 – 2016-08-25 (×4): 7.5 mg via INTRAVENOUS
  Filled 2016-08-24 (×4): qty 1

## 2016-08-24 MED ORDER — DOCUSATE SODIUM 100 MG PO CAPS
100.0000 mg | ORAL_CAPSULE | Freq: Two times a day (BID) | ORAL | Status: DC
  Administered 2016-08-24 – 2016-08-25 (×3): 100 mg via ORAL
  Filled 2016-08-24 (×3): qty 1

## 2016-08-24 MED ORDER — ACETAMINOPHEN 325 MG PO TABS: 650.0000 mg | ORAL_TABLET | Freq: Four times a day (QID) | ORAL | Status: DC | PRN

## 2016-08-24 MED ORDER — TRANEXAMIC ACID 1000 MG/10ML IV SOLN
1000.0000 mg | INTRAVENOUS | Status: AC
  Administered 2016-08-24: 1000 mg via INTRAVENOUS
  Filled 2016-08-24: qty 1100

## 2016-08-24 MED ORDER — LEVOTHYROXINE SODIUM 88 MCG PO TABS
88.0000 ug | ORAL_TABLET | Freq: Every day | ORAL | Status: DC
  Administered 2016-08-25: 08:00:00 88 ug via ORAL
  Filled 2016-08-24: qty 1

## 2016-08-24 MED ORDER — POLYETHYLENE GLYCOL 3350 17 G PO PACK: 17.0000 g | PACK | Freq: Every day | ORAL | Status: DC | PRN

## 2016-08-24 MED ORDER — METHOCARBAMOL 500 MG PO TABS: 500.0000 mg | ORAL_TABLET | Freq: Four times a day (QID) | ORAL | Status: DC | PRN

## 2016-08-24 MED ORDER — OXYCODONE HCL 5 MG PO TABS
5.0000 mg | ORAL_TABLET | ORAL | Status: DC | PRN
  Administered 2016-08-24: 5 mg via ORAL
  Administered 2016-08-24: 10 mg via ORAL
  Administered 2016-08-24: 5 mg via ORAL
  Administered 2016-08-25 (×3): 10 mg via ORAL
  Filled 2016-08-24: qty 1
  Filled 2016-08-24 (×2): qty 2
  Filled 2016-08-24: qty 1
  Filled 2016-08-24 (×2): qty 2

## 2016-08-24 MED ORDER — METOCLOPRAMIDE HCL 5 MG/ML IJ SOLN: 5.0000 mg | Freq: Three times a day (TID) | INTRAMUSCULAR | Status: DC | PRN

## 2016-08-24 MED ORDER — PHENYLEPHRINE HCL 10 MG/ML IJ SOLN
INTRAMUSCULAR | Status: AC
  Filled 2016-08-24: qty 1

## 2016-08-24 MED ORDER — DEXAMETHASONE SODIUM PHOSPHATE 10 MG/ML IJ SOLN
INTRAMUSCULAR | Status: DC | PRN
  Administered 2016-08-24: 8 mg via INTRAVENOUS

## 2016-08-24 MED ORDER — PROPOFOL 500 MG/50ML IV EMUL
INTRAVENOUS | Status: DC | PRN
  Administered 2016-08-24: 50 ug/kg/min via INTRAVENOUS

## 2016-08-24 MED ORDER — STERILE WATER FOR IRRIGATION IR SOLN
Status: DC | PRN
  Administered 2016-08-24: 3000 mL

## 2016-08-24 MED ORDER — CLINDAMYCIN PHOSPHATE 900 MG/50ML IV SOLN
900.0000 mg | INTRAVENOUS | Status: AC
  Administered 2016-08-24: 900 mg via INTRAVENOUS

## 2016-08-24 MED ORDER — BUPIVACAINE IN DEXTROSE 0.75-8.25 % IT SOLN
INTRATHECAL | Status: DC | PRN
  Administered 2016-08-24: 1.8 mL via INTRATHECAL

## 2016-08-24 MED ORDER — LACTATED RINGERS IV SOLN
INTRAVENOUS | Status: DC | PRN
  Administered 2016-08-24 (×2): via INTRAVENOUS

## 2016-08-24 MED ORDER — METOCLOPRAMIDE HCL 5 MG PO TABS: 5.0000 mg | ORAL_TABLET | Freq: Three times a day (TID) | ORAL | Status: DC | PRN

## 2016-08-24 MED ORDER — ZOLPIDEM TARTRATE 5 MG PO TABS
5.0000 mg | ORAL_TABLET | Freq: Every evening | ORAL | Status: DC | PRN
  Filled 2016-08-24: qty 1

## 2016-08-24 MED ORDER — CITALOPRAM HYDROBROMIDE 20 MG PO TABS
40.0000 mg | ORAL_TABLET | Freq: Every day | ORAL | Status: DC
  Administered 2016-08-25: 40 mg via ORAL
  Filled 2016-08-24 (×2): qty 2

## 2016-08-24 MED ORDER — CLINDAMYCIN PHOSPHATE 600 MG/50ML IV SOLN
600.0000 mg | Freq: Four times a day (QID) | INTRAVENOUS | Status: AC
  Administered 2016-08-24 (×2): 600 mg via INTRAVENOUS
  Filled 2016-08-24 (×2): qty 50

## 2016-08-24 MED ORDER — DEXTROSE 5 % IV SOLN
INTRAVENOUS | Status: DC | PRN
  Administered 2016-08-24: 40 ug/min via INTRAVENOUS

## 2016-08-24 MED ORDER — PHENOL 1.4 % MT LIQD
1.0000 | OROMUCOSAL | Status: DC | PRN
Start: 2016-08-24 — End: 2016-08-25

## 2016-08-24 MED ORDER — ASPIRIN 81 MG PO CHEW
81.0000 mg | CHEWABLE_TABLET | Freq: Two times a day (BID) | ORAL | Status: DC
  Administered 2016-08-24 – 2016-08-25 (×2): 81 mg via ORAL
  Filled 2016-08-24 (×2): qty 1

## 2016-08-24 MED ORDER — 0.9 % SODIUM CHLORIDE (POUR BTL) OPTIME
TOPICAL | Status: DC | PRN
  Administered 2016-08-24: 1000 mL

## 2016-08-24 MED ORDER — LOSARTAN POTASSIUM 50 MG PO TABS
50.0000 mg | ORAL_TABLET | Freq: Every day | ORAL | Status: DC
  Administered 2016-08-25: 50 mg via ORAL
  Filled 2016-08-24: qty 1

## 2016-08-24 MED ORDER — MORPHINE SULFATE (PF) 2 MG/ML IV SOLN: 2.0000 mg | INTRAVENOUS | Status: DC | PRN

## 2016-08-24 MED ORDER — ONDANSETRON HCL 4 MG/2ML IJ SOLN: 4.0000 mg | Freq: Four times a day (QID) | INTRAMUSCULAR | Status: DC | PRN

## 2016-08-24 MED ORDER — CLINDAMYCIN PHOSPHATE 900 MG/50ML IV SOLN
INTRAVENOUS | Status: AC
  Filled 2016-08-24: qty 50

## 2016-08-24 MED ORDER — FENTANYL CITRATE (PF) 100 MCG/2ML IJ SOLN
INTRAMUSCULAR | Status: DC | PRN
  Administered 2016-08-24: 50 ug via INTRAVENOUS

## 2016-08-24 MED ORDER — ONDANSETRON HCL 4 MG PO TABS: 4.0000 mg | ORAL_TABLET | Freq: Four times a day (QID) | ORAL | Status: DC | PRN

## 2016-08-24 MED ORDER — MENTHOL 3 MG MT LOZG: 1.0000 | LOZENGE | OROMUCOSAL | Status: DC | PRN

## 2016-08-24 MED ORDER — LORATADINE 10 MG PO TABS
10.0000 mg | ORAL_TABLET | Freq: Every day | ORAL | Status: DC
  Administered 2016-08-24 – 2016-08-25 (×2): 10 mg via ORAL
  Filled 2016-08-24 (×2): qty 1

## 2016-08-24 MED ORDER — TRAMADOL HCL 50 MG PO TABS: 100.0000 mg | ORAL_TABLET | Freq: Four times a day (QID) | ORAL | Status: DC | PRN

## 2016-08-24 MED ORDER — ALUM & MAG HYDROXIDE-SIMETH 200-200-20 MG/5ML PO SUSP: 30.0000 mL | ORAL | Status: DC | PRN

## 2016-08-24 MED ORDER — PROPOFOL 10 MG/ML IV BOLUS
INTRAVENOUS | Status: DC | PRN
  Administered 2016-08-24 (×5): 20 mg via INTRAVENOUS

## 2016-08-24 MED ORDER — ROSUVASTATIN CALCIUM 20 MG PO TABS: 20.0000 mg | ORAL_TABLET | Freq: Every day | ORAL | Status: DC

## 2016-08-24 MED ORDER — DIPHENHYDRAMINE HCL 12.5 MG/5ML PO ELIX: 12.5000 mg | ORAL_SOLUTION | ORAL | Status: DC | PRN

## 2016-08-24 MED ORDER — LOSARTAN POTASSIUM-HCTZ 50-12.5 MG PO TABS: 1.0000 | ORAL_TABLET | Freq: Every day | ORAL | Status: DC

## 2016-08-24 MED ORDER — PROPOFOL 10 MG/ML IV BOLUS
INTRAVENOUS | Status: AC
  Filled 2016-08-24: qty 60

## 2016-08-24 MED ORDER — ONDANSETRON HCL 4 MG/2ML IJ SOLN
INTRAMUSCULAR | Status: DC | PRN
Start: 2016-08-24 — End: 2016-08-24
  Administered 2016-08-24: 4 mg via INTRAVENOUS

## 2016-08-24 MED ORDER — SODIUM CHLORIDE 0.9 % IV SOLN
INTRAVENOUS | Status: DC
  Administered 2016-08-24 – 2016-08-25 (×2): via INTRAVENOUS

## 2016-08-24 MED ORDER — ACETAMINOPHEN 650 MG RE SUPP: 650.0000 mg | Freq: Four times a day (QID) | RECTAL | Status: DC | PRN

## 2016-08-24 MED ORDER — VITAMIN B-12 1000 MCG PO TABS
4000.0000 ug | ORAL_TABLET | Freq: Every day | ORAL | Status: DC
Start: 2016-08-24 — End: 2016-08-25
  Administered 2016-08-24 – 2016-08-25 (×2): 4000 ug via ORAL
  Filled 2016-08-24 (×2): qty 4

## 2016-08-24 MED ORDER — HYDROMORPHONE HCL 1 MG/ML IJ SOLN: 0.5000 mg | INTRAMUSCULAR | Status: DC | PRN

## 2016-08-24 MED ORDER — SODIUM CHLORIDE 0.9 % IR SOLN
Status: DC | PRN
  Administered 2016-08-24: 1000 mL

## 2016-08-24 MED ORDER — CHLORHEXIDINE GLUCONATE 4 % EX LIQD: 60.0000 mL | Freq: Once | CUTANEOUS | Status: DC

## 2016-08-24 MED ORDER — HYDROCHLOROTHIAZIDE 12.5 MG PO CAPS
12.5000 mg | ORAL_CAPSULE | Freq: Every day | ORAL | Status: DC
  Administered 2016-08-25: 12.5 mg via ORAL
  Filled 2016-08-24: qty 1

## 2016-08-24 MED ORDER — DEXTROSE 5 % IV SOLN
500.0000 mg | Freq: Four times a day (QID) | INTRAVENOUS | Status: DC | PRN
  Filled 2016-08-24: qty 5

## 2016-08-24 MED ORDER — HYDROMORPHONE HCL 1 MG/ML IJ SOLN: 0.2500 mg | INTRAMUSCULAR | Status: DC | PRN

## 2016-08-24 SURGICAL SUPPLY — 36 items
BAG ZIPLOCK 12X15 (MISCELLANEOUS) IMPLANT
BENZOIN TINCTURE PRP APPL 2/3 (GAUZE/BANDAGES/DRESSINGS) ×3 IMPLANT
BLADE SAW SGTL 18X1.27X75 (BLADE) ×2 IMPLANT
BLADE SAW SGTL 18X1.27X75MM (BLADE) ×1
CAPT HIP TOTAL 2 ×3 IMPLANT
CELLS DAT CNTRL 66122 CELL SVR (MISCELLANEOUS) ×1 IMPLANT
CLOSURE WOUND 1/2 X4 (GAUZE/BANDAGES/DRESSINGS) ×1
CLOTH BEACON ORANGE TIMEOUT ST (SAFETY) ×3 IMPLANT
COVER PERINEAL POST (MISCELLANEOUS) ×3 IMPLANT
DRAPE STERI IOBAN 125X83 (DRAPES) ×3 IMPLANT
DRAPE U-SHAPE 47X51 STRL (DRAPES) ×6 IMPLANT
DRSG AQUACEL AG ADV 3.5X10 (GAUZE/BANDAGES/DRESSINGS) ×3 IMPLANT
DURAPREP 26ML APPLICATOR (WOUND CARE) ×3 IMPLANT
ELECT REM PT RETURN 9FT ADLT (ELECTROSURGICAL) ×3
ELECTRODE REM PT RTRN 9FT ADLT (ELECTROSURGICAL) ×1 IMPLANT
GAUZE XEROFORM 1X8 LF (GAUZE/BANDAGES/DRESSINGS) IMPLANT
GLOVE BIO SURGEON STRL SZ7.5 (GLOVE) ×3 IMPLANT
GLOVE BIOGEL PI IND STRL 8 (GLOVE) ×8 IMPLANT
GLOVE BIOGEL PI INDICATOR 8 (GLOVE) ×16
GLOVE ECLIPSE 8.0 STRL XLNG CF (GLOVE) ×3 IMPLANT
GOWN STRL REUS W/TWL XL LVL3 (GOWN DISPOSABLE) ×12 IMPLANT
HANDPIECE INTERPULSE COAX TIP (DISPOSABLE) ×3
HOLDER FOLEY CATH W/STRAP (MISCELLANEOUS) ×3 IMPLANT
PACK ANTERIOR HIP CUSTOM (KITS) ×3 IMPLANT
RTRCTR WOUND ALEXIS 18CM MED (MISCELLANEOUS) ×3
SET HNDPC FAN SPRY TIP SCT (DISPOSABLE) ×1 IMPLANT
STAPLER VISISTAT 35W (STAPLE) IMPLANT
STRIP CLOSURE SKIN 1/2X4 (GAUZE/BANDAGES/DRESSINGS) ×2 IMPLANT
SUT ETHIBOND NAB CT1 #1 30IN (SUTURE) ×3 IMPLANT
SUT MNCRL AB 4-0 PS2 18 (SUTURE) ×3 IMPLANT
SUT VIC AB 0 CT1 36 (SUTURE) ×3 IMPLANT
SUT VIC AB 1 CT1 36 (SUTURE) ×3 IMPLANT
SUT VIC AB 2-0 CT1 27 (SUTURE) ×6
SUT VIC AB 2-0 CT1 TAPERPNT 27 (SUTURE) ×2 IMPLANT
TRAY FOLEY W/METER SILVER 16FR (SET/KITS/TRAYS/PACK) ×3 IMPLANT
YANKAUER SUCT BULB TIP 10FT TU (MISCELLANEOUS) ×3 IMPLANT

## 2016-08-24 NOTE — Anesthesia Procedure Notes (Signed)
Spinal  Patient location during procedure: OR Start time: 08/24/2016 7:25 AM End time: 08/24/2016 7:30 AM Reason for block: at surgeon's request Staffing Resident/CRNA: Anne Fu Performed: resident/CRNA  Preanesthetic Checklist Completed: patient identified, site marked, surgical consent, pre-op evaluation, timeout performed, IV checked, risks and benefits discussed and monitors and equipment checked Spinal Block Patient position: sitting Prep: DuraPrep Patient monitoring: heart rate, continuous pulse ox and blood pressure Approach: right paramedian Location: L3-4 Injection technique: single-shot Needle Needle type: Pencan  Needle gauge: 24 G Needle length: 9 cm Assessment Sensory level: T6 Additional Notes Expiration date of kit checked and confirmed. Patient tolerated procedure well, without complications. X 1 attempt with noted clear CSF return. Loss of motor and sensory on exam post injection.

## 2016-08-24 NOTE — Progress Notes (Signed)
Active with [facility].

## 2016-08-24 NOTE — H&P (Signed)
TOTAL HIP ADMISSION H&P  Patient is admitted for right total hip arthroplasty.  Subjective:  Chief Complaint: right hip pain  HPI: Darlene Sanders, 71 y.o. female, has a history of pain and functional disability in the right hip(s) due to arthritis and patient has failed non-surgical conservative treatments for greater than 12 weeks to include NSAID's and/or analgesics, corticosteriod injections, use of assistive devices and activity modification.  Onset of symptoms was gradual starting 5 years ago with gradually worsening course since that time.The patient noted no past surgery on the right hip(s).  Patient currently rates pain in the right hip at 10 out of 10 with activity. Patient has night pain, worsening of pain with activity and weight bearing, trendelenberg gait, pain that interfers with activities of daily living, pain with passive range of motion and crepitus. Patient has evidence of subchondral cysts, subchondral sclerosis, periarticular osteophytes and joint space narrowing by imaging studies. This condition presents safety issues increasing the risk of falls.  There is no current active infection.  Patient Active Problem List   Diagnosis Date Noted  . Unilateral primary osteoarthritis, right hip 08/24/2016   Past Medical History:  Diagnosis Date  . Arthritis    osteoarthiritis related  . Asthma    many years ago, age 67  . Cancer [facility]) 2012   facial melanoma removal ; MOHs  . Depression   . Diverticulitis   . Headache   . Hypertension   . Hypothyroidism     Past Surgical History:  Procedure Laterality Date  . ABDOMINAL PERINEAL BOWEL RESECTION  2007  . COLONOSCOPY  2017  . [place]  2008  . TONSILLECTOMY     years ago, childhood    Prescriptions Prior to Admission  Medication Sig Dispense Refill Last Dose  . aspirin 81 MG tablet Take 81 mg by mouth daily.   08/24/2016 at 0430  . cetirizine (ZYRTEC) 10 MG tablet Take 10 mg by mouth daily.   08/24/2016 at  0430  . citalopram (CELEXA) 40 MG tablet Take 40 mg by mouth daily.   08/24/2016 at 0430  . cyanocobalamin 2000 MCG tablet Take 4,000 mcg by mouth daily.   08/24/2016 at 0430  . losartan-hydrochlorothiazide (HYZAAR) 50-12.5 MG tablet Take 1 tablet by mouth daily.   08/23/2016 at Unknown time  . mometasone (NASONEX) 50 MCG/ACT nasal spray Place 2 sprays into the nose daily.   08/23/2016 at Unknown time  . rosuvastatin (CRESTOR) 20 MG tablet Take 20 mg by mouth daily.   08/24/2016 at 0430  . SYNTHROID 88 MCG tablet Take 88 mcg by mouth daily before breakfast.    08/24/2016 at 0430   Allergies  Allergen Reactions  . Codeine Nausea Only  . Fluorescein Other (See Comments)    Pt says she "passed out"  . Hydromorphone Nausea Only  . Penicillins Rash    Has patient had a PCN reaction causing immediate rash, facial/tongue/throat swelling, SOB or lightheadedness with hypotension: unknown Has patient had a PCN reaction causing severe rash involving mucus membranes or skin necrosis: Yes Has patient had a PCN reaction that required hospitalization No Has patient had a PCN reaction occurring within the last 10 years: No If all of the above answers are "NO", then may proceed with Cephalosporin use.     Social History  Substance Use Topics  . Smoking status: Current Every Day Smoker    Packs/day: 0.25    Years: 15.00    Types: Cigarettes  . Smokeless tobacco:  Never Used  . Alcohol use Yes     Comment: social drinker    History reviewed. No pertinent family history.   Review of Systems  Musculoskeletal: Positive for joint pain.  All other systems reviewed and are negative.   Objective:  Physical Exam  Constitutional: She is oriented to person, place, and time. She appears well-developed and well-nourished.  HENT:  Head: Normocephalic and atraumatic.  Eyes: EOM are normal. Pupils are equal, round, and reactive to light.  Neck: Normal range of motion. Neck supple.  Cardiovascular: Normal rate  and regular rhythm.   Respiratory: Effort normal and breath sounds normal.  GI: Soft. Bowel sounds are normal.  Musculoskeletal:       Right hip: She exhibits decreased range of motion, decreased strength, tenderness and bony tenderness.  Neurological: She is alert and oriented to person, place, and time.  Skin: Skin is warm and dry.  Psychiatric: She has a normal mood and affect.    Vital signs in last 24 hours: Temp:  [97.9 F (36.6 C)] 97.9 F (36.6 C) (01/12 0558) Pulse Rate:  [97] 97 (01/12 0558) Resp:  [16] 16 (01/12 0558) BP: (111)/(86) 111/86 (01/12 0558) SpO2:  [4 %-94 %] 4 % (01/12 0641) Weight:  [150 lb (68 kg)] 150 lb (68 kg) (01/12 0610)  Labs:   Estimated body mass index is 24.21 kg/m as calculated from the following:   Height as of this encounter: 5\' 6"  (1.676 m).   Weight as of this encounter: 150 lb (68 kg).   Imaging Review Plain radiographs demonstrate severe degenerative joint disease of the right hip(s). The bone quality appears to be excellent for age and reported activity level.  Assessment/Plan:  End stage arthritis, right hip(s)  The patient history, physical examination, clinical judgement of the provider and imaging studies are consistent with end stage degenerative joint disease of the right hip(s) and total hip arthroplasty is deemed medically necessary. The treatment options including medical management, injection therapy, arthroscopy and arthroplasty were discussed at length. The risks and benefits of total hip arthroplasty were presented and reviewed. The risks due to aseptic loosening, infection, stiffness, dislocation/subluxation,  thromboembolic complications and other imponderables were discussed.  The patient acknowledged the explanation, agreed to proceed with the plan and consent was signed. Patient is being admitted for inpatient treatment for surgery, pain control, PT, OT, prophylactic antibiotics, VTE prophylaxis, progressive ambulation  and ADL's and discharge planning.The patient is planning to be discharged home with home health services

## 2016-08-24 NOTE — Transfer of Care (Signed)
Immediate Anesthesia Transfer of Care Note  Patient: Darlene Sanders  Procedure(s) Performed: Procedure(s): RIGHT TOTAL HIP ARTHROPLASTY ANTERIOR APPROACH (Right)  Patient Location: PACU  Anesthesia Type:Regional  Level of Consciousness: awake, alert  and oriented  Airway & Oxygen Therapy: Patient Spontanous Breathing and Patient connected to face mask oxygen  Post-op Assessment: Report given to RN and Post -op Vital signs reviewed and stable  Post vital signs: Reviewed and stable  Last Vitals:  Vitals:   08/24/16 0558  BP: 111/86  Pulse: 97  Resp: 16  Temp: 36.6 C    Last Pain:  Vitals:   08/24/16 0558  TempSrc: Oral      Patients Stated Pain Goal: 3 ([id number])  Complications: No apparent anesthesia complications

## 2016-08-24 NOTE — Evaluation (Signed)
Physical Therapy Evaluation Patient Details Name: Darlene Sanders MRN: [id number] DOB: 11-29-1945 Today's Date: 08/24/2016   History of Present Illness  71 yo female s/p R THA-direct anterior 08/24/16  Clinical Impression  On eval POD 0, pt was Min guard assist for mobility. She walked ~70 feet with a RW. Pain was tolerable with activity. Will follow and progress activity as able.     Follow Up Recommendations Home health PT    Equipment Recommendations  None recommended by PT    Recommendations for Other Services OT consult     Precautions / Restrictions Precautions Precautions: Fall Restrictions Weight Bearing Restrictions: No RLE Weight Bearing: Weight bearing as tolerated      Mobility  Bed Mobility Overal bed mobility: Needs Assistance Bed Mobility: Supine to Sit     Supine to sit: Min guard;HOB elevated     General bed mobility comments: close guard for safety.   Transfers Overall transfer level: Needs assistance Equipment used: Rolling walker (2 wheeled) Transfers: Sit to/from Stand Sit to Stand: Min guard         General transfer comment: close guard for safety  Ambulation/Gait Ambulation/Gait assistance: Min guard Ambulation Distance (Feet): 70 Feet Assistive device: Rolling walker (2 wheeled) Gait Pattern/deviations: Step-to pattern;Step-through pattern;Decreased stride length     General Gait Details: close guard for safety.   Stairs            Wheelchair Mobility    Modified Rankin (Stroke Patients Only)       Balance                                             Pertinent Vitals/Pain Pain Assessment: 0-10 Pain Score: 5  Pain Location: R hip/thigh Pain Descriptors / Indicators: Sore Pain Intervention(s): Monitored during session;Repositioned;Ice applied    Home Living Family/patient expects to be discharged to:: Private residence Living Arrangements: Alone Available Help at Discharge: Family Type of Home:  House Home Access: Stairs to enter Entrance Stairs-Rails: None Entrance Stairs-Number of Steps: 2 Home Layout: Two level;Able to live on main level with bedroom/bathroom Home Equipment: Gilford Rile - 2 wheels;Cane - single point      Prior Function Level of Independence: Independent               Hand Dominance        Extremity/Trunk Assessment   Upper Extremity Assessment Upper Extremity Assessment: Defer to OT evaluation    Lower Extremity Assessment Lower Extremity Assessment: Generalized weakness (s/p R THA)    Cervical / Trunk Assessment Cervical / Trunk Assessment: Normal  Communication   Communication: No difficulties  Cognition Arousal/Alertness: Awake/alert Behavior During Therapy: WFL for tasks assessed/performed Overall Cognitive Status: Within Functional Limits for tasks assessed                      General Comments      Exercises     Assessment/Plan    PT Assessment Patient needs continued PT services  PT Problem List Decreased strength;Decreased mobility;Decreased balance;Decreased activity tolerance;Decreased knowledge of use of DME;Pain;Decreased range of motion          PT Treatment Interventions DME instruction;Therapeutic activities;Gait training;Therapeutic exercise;Functional mobility training;Patient/family education;Stair training;Balance training    PT Goals (Current goals can be found in the Care Plan section)  Acute Rehab PT Goals Patient Stated Goal: regain independence PT Goal  Formulation: With patient Time For Goal Achievement: 09/07/16 Potential to Achieve Goals: Good    Frequency 7X/week   Barriers to discharge        Co-evaluation               End of Session Equipment Utilized During Treatment: Gait belt Activity Tolerance: Patient tolerated treatment well Patient left: in chair;with call bell/phone within reach;with chair alarm set;with family/visitor present           Time: [id number] PT Time  Calculation (min) (ACUTE ONLY): 18 min   Charges:   PT Evaluation $PT Eval Low Complexity: 1 Procedure     PT G Codes:        Weston Anna, MPT Pager: [phone]

## 2016-08-24 NOTE — Progress Notes (Signed)
  PHARMACY NOTE:  CITALOPRAM (CELEXA) DOSAGE  Order noted to continue citalopram 40 mg daily as taken PTA.  Please note that in patients older than age 71,  FDA currently recommends limiting citalopram dosage to not more than 20 mg daily due to potential for QTc prolongation with higher dosages.  QTc on EKG 08/20/16 = 457   Recommendation: Assess risk vs benefit of current citalopram dosage.    Thank you -  Clayburn Pert, PharmD, BCPS Pager: [phone] 08/24/2016  11:07 AM

## 2016-08-24 NOTE — Anesthesia Preprocedure Evaluation (Addendum)
Anesthesia Evaluation  Patient identified by MRN, date of birth, ID band Patient awake    Reviewed: Allergy & Precautions, NPO status , Patient's Chart, lab work & pertinent test results  Airway Mallampati: II  TM Distance: >3 FB     Dental   Pulmonary asthma , Current Smoker,    breath sounds clear to auscultation       Cardiovascular hypertension,  Rhythm:Regular Rate:Normal     Neuro/Psych    GI/Hepatic negative GI ROS, Neg liver ROS,   Endo/Other  Hypothyroidism   Renal/GU negative Renal ROS     Musculoskeletal   Abdominal   Peds  Hematology   Anesthesia Other Findings   Reproductive/Obstetrics                             Anesthesia Physical Anesthesia Plan  ASA: III  Anesthesia Plan: Spinal   Post-op Pain Management:    Induction:   Airway Management Planned: Simple Face Mask  Additional Equipment:   Intra-op Plan:   Post-operative Plan:   Informed Consent: I have reviewed the patients History and Physical, chart, labs and discussed the procedure including the risks, benefits and alternatives for the proposed anesthesia with the patient or authorized representative who has indicated his/her understanding and acceptance.   Dental advisory given  Plan Discussed with: CRNA and Anesthesiologist  Anesthesia Plan Comments:         Anesthesia Quick Evaluation

## 2016-08-24 NOTE — Brief Op Note (Signed)
08/24/2016  8:33 AM  PATIENT:  Darlene Sanders  71 y.o. female  PRE-OPERATIVE DIAGNOSIS:  osteoarthritis right hip  POST-OPERATIVE DIAGNOSIS:  osteoarthritis right hip  PROCEDURE:  Procedure(s): RIGHT TOTAL HIP ARTHROPLASTY ANTERIOR APPROACH (Right)  SURGEON:  Surgeon(s) and Role:    * Mcarthur Rossetti, MD - Primary  PHYSICIAN ASSISTANT: Benita Stabile, PA-C  ANESTHESIA:   spinal  EBL:  Total I/O In: -  Out: 200 [Urine:100; Blood:100]  COUNTS:  YES  DICTATION: .Other Dictation: Dictation Number [id number]  PLAN OF CARE: Admit to inpatient   PATIENT DISPOSITION:  PACU - hemodynamically stable.   Delay start of Pharmacological VTE agent (>24hrs) due to surgical blood loss or risk of bleeding: no

## 2016-08-24 NOTE — Anesthesia Postprocedure Evaluation (Signed)
Anesthesia Post Note  Patient: Darlene Sanders  Procedure(s) Performed: Procedure(s) (LRB): RIGHT TOTAL HIP ARTHROPLASTY ANTERIOR APPROACH (Right)  Patient location during evaluation: PACU Anesthesia Type: Spinal Level of consciousness: awake Pain management: pain level controlled Respiratory status: spontaneous breathing Cardiovascular status: stable Anesthetic complications: no       Last Vitals:  Vitals:   08/24/16 0924 08/24/16 0945  BP:  96/62  Pulse: 64 63  Resp: 13 15  Temp:  36.7 C    Last Pain:  Vitals:   08/24/16 0558  TempSrc: Oral                 Carolos Fecher

## 2016-08-24 NOTE — Anesthesia Postprocedure Evaluation (Signed)
Anesthesia Post Note  Patient: Darlene Sanders  Procedure(s) Performed: Procedure(s) (LRB): RIGHT TOTAL HIP ARTHROPLASTY ANTERIOR APPROACH (Right)  Patient location during evaluation: PACU Anesthesia Type: Spinal Level of consciousness: awake Pain management: pain level controlled Vital Signs Assessment: post-procedure vital signs reviewed and stable Respiratory status: spontaneous breathing Cardiovascular status: stable Anesthetic complications: no       Last Vitals:  Vitals:   08/24/16 1330 08/24/16 1427  BP: 122/65 123/69  Pulse: 80 80  Resp: 16 16  Temp: 36.8 C 36.8 C    Last Pain:  Vitals:   08/24/16 1746  TempSrc:   PainSc: Asleep                 Leotta Weingarten

## 2016-08-25 LAB — CBC
HEMATOCRIT: 36.3 % (ref 36.0–46.0)
HEMOGLOBIN: 11.8 g/dL — AB (ref 12.0–15.0)
MCH: 30.2 pg (ref 26.0–34.0)
MCHC: 32.5 g/dL (ref 30.0–36.0)
MCV: 92.8 fL (ref 78.0–100.0)
Platelets: 171 10*3/uL (ref 150–400)
RBC: 3.91 MIL/uL (ref 3.87–5.11)
RDW: 13.7 % (ref 11.5–15.5)
WBC: 11.7 10*3/uL — ABNORMAL HIGH (ref 4.0–10.5)

## 2016-08-25 LAB — BASIC METABOLIC PANEL
Anion gap: 8 (ref 5–15)
BUN: 14 mg/dL (ref 6–20)
CHLORIDE: 106 mmol/L (ref 101–111)
CO2: 26 mmol/L (ref 22–32)
CREATININE: 0.67 mg/dL (ref 0.44–1.00)
Calcium: 8.9 mg/dL (ref 8.9–10.3)
GFR calc Af Amer: >60 mL/min (ref >60)
GFR calc non Af Amer: >60 mL/min (ref >60)
GLUCOSE: 113 mg/dL — AB (ref 65–99)
POTASSIUM: 4.6 mmol/L (ref 3.5–5.1)
Sodium: 140 mmol/L (ref 135–145)

## 2016-08-25 MED ORDER — METHOCARBAMOL 500 MG PO TABS: 500.0000 mg | ORAL_TABLET | Freq: Four times a day (QID) | ORAL | 0 refills | Status: DC | PRN

## 2016-08-25 MED ORDER — ASPIRIN 81 MG PO TABS: 81.0000 mg | ORAL_TABLET | Freq: Two times a day (BID) | ORAL | 0 refills | Status: DC

## 2016-08-25 MED ORDER — OXYCODONE HCL 5 MG PO TABS: 5.0000 mg | ORAL_TABLET | ORAL | 0 refills | Status: DC | PRN

## 2016-08-25 MED ORDER — DOCUSATE SODIUM 100 MG PO CAPS: 100.0000 mg | ORAL_CAPSULE | Freq: Two times a day (BID) | ORAL | 0 refills | Status: DC

## 2016-08-25 NOTE — Progress Notes (Signed)
Subjective: Pt doing well - she has passed the necessary functional tests from physical therapy to be discharged today.  Objective: Vital signs in last 24 hours: Temp:  [97 F (36.1 C)-98.8 F (37.1 C)] 98 F (36.7 C) (01/13 1005) Pulse Rate:  [76-84] 76 (01/13 1005) Resp:  [16-18] 18 (01/13 1005) BP: (120-137)/(55-83) 126/71 (01/13 1005) SpO2:  [96 %-100 %] 97 % (01/13 1005)  Intake/Output from previous day: 01/12 0701 - 01/13 0700 In: 4272.5 [P.O.:1170; I.V.:2802.5; IV Piggyback:300] Out: 975 [Urine:875; Blood:100] Intake/Output this shift: Total I/O In: 181.3 [P.O.:120; I.V.:61.3] Out: -   Exam:  Sensation intact distally Dorsiflexion/Plantar flexion intact  Labs:  Recent Labs  08/25/16 0528  HGB 11.8*    Recent Labs  08/25/16 0528  WBC 11.7*  RBC 3.91  HCT 36.3  PLT 171    Recent Labs  08/25/16 0528  NA 140  K 4.6  CL 106  CO2 26  BUN 14  CREATININE 0.67  GLUCOSE 113*  CALCIUM 8.9   No results for input(s): LABPT, INR in the last 72 hours.  Assessment/Plan: Discharged today.   Landry Dyke Dean 08/25/2016, 10:39 AM

## 2016-08-25 NOTE — Evaluation (Signed)
Occupational Therapy Evaluation Patient Details Name: CAROLLE DUELL MRN: [id number] DOB: 01-19-1946 Today's Date: 08/25/2016    History of Present Illness 71 yo female s/p R THA-direct anterior 08/24/16   Clinical Impression   Pt was admitted for the above sx. Will plan to see her one more time prior to discharge to practice with AE with dressing.      Follow Up Recommendations  Supervision/Assistance - 24 hour    Equipment Recommendations  None recommended by OT    Recommendations for Other Services       Precautions / Restrictions Precautions Precautions: Fall Restrictions Weight Bearing Restrictions: No RLE Weight Bearing: Weight bearing as tolerated      Mobility Bed Mobility         Supine to sit: Supervision     General bed mobility comments: from flat bed  Transfers   Equipment used: Rolling walker (2 wheeled)   Sit to Stand: Min guard         General transfer comment: for safety    Balance                                            ADL Overall ADL's : Needs assistance/impaired     Grooming: Oral care;Supervision/safety;Standing   Upper Body Bathing: Set up;Sitting   Lower Body Bathing: Minimal assistance;Sit to/from stand   Upper Body Dressing : Set up;Sitting   Lower Body Dressing: Moderate assistance;Sit to/from stand   Toilet Transfer: Min guard;Ambulation;BSC;RW   Toileting- Water quality scientist and Hygiene: Min guard;Sit to/from stand   Tub/ Shower Transfer: Walk-in shower;Min guard;Ambulation;3 in 1     General ADL Comments: Pt will have sister helping until Wed then she will have intermittent assistance from friends. Educated on AE but only used sock aide as pt did not use this session as her clothing is not here. Assisted with mesh underwear     Vision     Perception     Praxis      Pertinent Vitals/Pain Pain Score: 2  Pain Location: R hip Pain Descriptors / Indicators: Sore Pain  Intervention(s): Limited activity within patient's tolerance;Monitored during session;Premedicated before session;Repositioned;Ice applied     Hand Dominance     Extremity/Trunk Assessment Upper Extremity Assessment Upper Extremity Assessment: Overall WFL for tasks assessed           Communication Communication Communication: No difficulties   Cognition Arousal/Alertness: Awake/alert Behavior During Therapy: WFL for tasks assessed/performed Overall Cognitive Status: Within Functional Limits for tasks assessed                     General Comments       Exercises       Shoulder Instructions      Home Living Family/patient expects to be discharged to:: Private residence Living Arrangements: Alone                 Bathroom Shower/Tub: Occupational psychologist: Handicapped height     Home Equipment: Shower seat          Prior Functioning/Environment Level of Independence: Independent                 OT Problem List: Decreased knowledge of use of DME or AE;Pain   OT Treatment/Interventions: Self-care/ADL training;DME and/or AE instruction;Patient/family education    OT Goals(Current goals can be  found in the care plan section) Acute Rehab OT Goals Patient Stated Goal: regain independence OT Goal Formulation: With patient Time For Goal Achievement: 08/27/16 Potential to Achieve Goals: Good ADL Goals Pt Will Perform Lower Body Dressing: with supervision;sit to/from stand;with adaptive equipment  OT Frequency: Min 2X/week   Barriers to D/C:            Co-evaluation              End of Session    Activity Tolerance: Patient tolerated treatment well Patient left: in chair;with call bell/phone within reach   Time: 0734-0800 OT Time Calculation (min): 26 min Charges:  OT General Charges $OT Visit: 1 Procedure OT Evaluation $OT Eval Low Complexity: 1 Procedure OT Treatments $Self Care/Home Management : 8-22  mins G-Codes:    Jereld Presti 02-Sep-2016, 8:41 AM  Lesle Chris, OTR/L [phone] 09/02/2016

## 2016-08-25 NOTE — Progress Notes (Signed)
Physical Therapy Treatment Patient Details Name: Darlene Sanders MRN: [id number] DOB: 06/13/1946 Today's Date: 08/25/2016    History of Present Illness 71 yo female s/p R THA-direct anterior 08/24/16    PT Comments    Progressing very well with mobility. Reviewed/practiced exercises, gait training, and stair training. Issued HEP for pt to perform 2x/day until follow up with surgeon. Instructed pt to use RW for at least 1-2 weeks for ambulation before transitioning to single point cane. All education completed. Ready to d/c from PT standpoint.    Follow Up Recommendations  No PT follow up;Supervision for mobility/OOB (Issued HEP for pt to perform independently)     Equipment Recommendations  None recommended by PT    Recommendations for Other Services OT consult     Precautions / Restrictions Precautions Precautions: None Restrictions Weight Bearing Restrictions: No RLE Weight Bearing: Weight bearing as tolerated    Mobility  Bed Mobility         Supine to sit: Supervision     General bed mobility comments: oob in recliner  Transfers Overall transfer level: Needs assistance Equipment used: Rolling walker (2 wheeled) Transfers: Sit to/from Stand Sit to Stand: Supervision         General transfer comment: for safety  Ambulation/Gait Ambulation/Gait assistance: Supervision Ambulation Distance (Feet): 150 Feet Assistive device: Rolling walker (2 wheeled) Gait Pattern/deviations: Step-through pattern;Decreased stride length     General Gait Details: for safety. good reciprocal gait pattern   Stairs Stairs: Yes   Stair Management: Step to pattern;Forwards;One rail Right Number of Stairs: 2 General stair comments: close guard for safety. VCs safety, technique, sequence.   Wheelchair Mobility    Modified Rankin (Stroke Patients Only)       Balance                                    Cognition Arousal/Alertness: Awake/alert Behavior  During Therapy: WFL for tasks assessed/performed Overall Cognitive Status: Within Functional Limits for tasks assessed                      Exercises Total Joint Exercises Hip ABduction/ADduction: AROM;Right;10 reps;Standing Long Arc Quad: AROM;Right;10 reps;Seated Knee Flexion: AROM;Right;10 reps;Standing Marching in Standing: AROM;Both;10 reps;Standing General Exercises - Lower Extremity Heel Raises: AROM;Both;10 reps;Standing    General Comments        Pertinent Vitals/Pain Pain Assessment: 0-10 Pain Score: 3  Pain Location: R thigh/hip Pain Descriptors / Indicators: Sore Pain Intervention(s): Monitored during session;Repositioned;Ice applied    Home Living Family/patient expects to be discharged to:: Private residence Living Arrangements: Alone           Home Equipment: Shower seat      Prior Function Level of Independence: Independent          PT Goals (current goals can now be found in the care plan section) Acute Rehab PT Goals Patient Stated Goal: regain independence Progress towards PT goals: Progressing toward goals    Frequency    7X/week      PT Plan Current plan remains appropriate    Co-evaluation             End of Session Equipment Utilized During Treatment: Gait belt Activity Tolerance: Patient tolerated treatment well Patient left: in chair;with call bell/phone within reach     Time: 1005-1023 PT Time Calculation (min) (ACUTE ONLY): 18 min  Charges:  $Gait Training: 8-22 mins  G Codes:      Weston Anna, MPT Pager: [phone]

## 2016-08-27 NOTE — Discharge Summary (Signed)
Patient ID: Darlene Sanders MRN: [id number] DOB/AGE: [birth year] 71 y.o.  Admit date: 08/24/2016 Discharge date: 08/27/2016  Admission Diagnoses:  Principal Problem:   Unilateral primary osteoarthritis, right hip Active Problems:   Status post total replacement of right hip   Discharge Diagnoses:  Same  Past Medical History:  Diagnosis Date  . Arthritis    osteoarthiritis related  . Asthma    many years ago, age 76  . Cancer [facility]) 2012   facial melanoma removal ; MOHs  . Depression   . Diverticulitis   . Headache   . Hypertension   . Hypothyroidism     Surgeries: Procedure(s): RIGHT TOTAL HIP ARTHROPLASTY ANTERIOR APPROACH on 08/24/2016   Consultants:   Discharged Condition: Improved  Hospital Course: Darlene Sanders is an 71 y.o. female who was admitted 08/24/2016 for operative treatment ofUnilateral primary osteoarthritis, right hip. Patient has severe unremitting pain that affects sleep, daily activities, and work/hobbies. After pre-op clearance the patient was taken to the operating room on 08/24/2016 and underwent  Procedure(s): RIGHT TOTAL HIP ARTHROPLASTY ANTERIOR APPROACH.    Patient was given perioperative antibiotics: Anti-infectives    Start     Dose/Rate Route Frequency Ordered Stop   08/24/16 1400  clindamycin (CLEOCIN) IVPB 600 mg     600 mg 100 mL/hr over 30 Minutes Intravenous Every 6 hours 08/24/16 1019 08/24/16 2026   08/24/16 0555  clindamycin (CLEOCIN) IVPB 900 mg     900 mg 100 mL/hr over 30 Minutes Intravenous On call to O.R. 08/24/16 [id number] 08/24/16 0739       Patient was given sequential compression devices, early ambulation, and chemoprophylaxis to prevent DVT.  Patient benefited maximally from hospital stay and there were no complications.    Recent vital signs: No data found.    Recent laboratory studies:  Recent Labs  08/25/16 0528  WBC 11.7*  HGB 11.8*  HCT 36.3  PLT 171  NA 140  K 4.6  CL 106  CO2 26  BUN 14  CREATININE 0.67   GLUCOSE 113*  CALCIUM 8.9     Discharge Medications:   Allergies as of 08/25/2016      Reactions   Codeine Nausea Only   Fluorescein Other (See Comments)   Pt says she "passed out"   Hydromorphone Nausea Only   Penicillins Rash   Has patient had a PCN reaction causing immediate rash, facial/tongue/throat swelling, SOB or lightheadedness with hypotension: unknown Has patient had a PCN reaction causing severe rash involving mucus membranes or skin necrosis: Yes Has patient had a PCN reaction that required hospitalization No Has patient had a PCN reaction occurring within the last 10 years: No If all of the above answers are "NO", then may proceed with Cephalosporin use.      Medication List    TAKE these medications   aspirin 81 MG tablet Take 1 tablet (81 mg total) by mouth 2 (two) times daily. What changed:  when to take this   cetirizine 10 MG tablet Commonly known as:  ZYRTEC Take 10 mg by mouth daily.   citalopram 40 MG tablet Commonly known as:  CELEXA Take 40 mg by mouth daily.   cyanocobalamin 2000 MCG tablet Take 4,000 mcg by mouth daily.   docusate sodium 100 MG capsule Commonly known as:  COLACE Take 1 capsule (100 mg total) by mouth 2 (two) times daily.   losartan-hydrochlorothiazide 50-12.5 MG tablet Commonly known as:  HYZAAR Take 1 tablet by mouth daily.   methocarbamol  500 MG tablet Commonly known as:  ROBAXIN Take 1 tablet (500 mg total) by mouth every 6 (six) hours as needed for muscle spasms.   mometasone 50 MCG/ACT nasal spray Commonly known as:  NASONEX Place 2 sprays into the nose daily.   oxyCODONE 5 MG immediate release tablet Commonly known as:  Oxy IR/ROXICODONE Take 1-2 tablets (5-10 mg total) by mouth every 3 (three) hours as needed for breakthrough pain.   rosuvastatin 20 MG tablet Commonly known as:  CRESTOR Take 20 mg by mouth daily.   SYNTHROID 88 MCG tablet Generic drug:  levothyroxine Take 88 mcg by mouth daily before  breakfast.       Diagnostic Studies: Dg Pelvis Portable  Result Date: 08/24/2016 CLINICAL DATA:  Status post right hip replacement. EXAM: PORTABLE PELVIS 1-2 VIEWS COMPARISON:  06/27/2016. FINDINGS: Interval right total hip prosthesis in satisfactory position and alignment. No fracture or dislocation seen. Hernia repair mesh springs. Small, calcified uterine fibroids. IMPRESSION: Satisfactory postoperative appearance of a right total hip prosthesis. Electronically Signed   By: Claudie Revering M.D.   On: 08/24/2016 09:28   Dg C-arm 1-60 Min-no Report  Result Date: 08/24/2016 There is no Radiologist interpretation  for this exam.  Dg Hip Operative Unilat W Or W/o Pelvis Right  Result Date: 08/24/2016 CLINICAL DATA:  Osteoarthritis of the right hip. Right total hip replacement. EXAM: OPERATIVE RIGHT HIP (WITH PELVIS IF PERFORMED) 2 VIEWS TECHNIQUE: Fluoroscopic spot image(s) were submitted for interpretation post-operatively. COMPARISON:  Radiographs dated 06/27/2016 FINDINGS: The patient has undergone right total hip prosthesis insertion. The components appear in excellent position in the AP projection. IMPRESSION: Satisfactory appearance of the right hip after total hip prosthesis insertion. Electronically Signed   By: Lorriane Shire M.D.   On: 08/24/2016 08:37    Disposition: 01-Home or Self Care  Discharge Instructions    Call MD / Call 911    Complete by:  As directed    If you experience chest pain or shortness of breath, CALL 911 and be transported to the hospital emergency room.  If you develope a fever above 101 F, pus (white drainage) or increased drainage or redness at the wound, or calf pain, call your surgeon's office.   Constipation Prevention    Complete by:  As directed    Drink plenty of fluids.  Prune juice may be helpful.  You may use a stool softener, such as Colace (over the counter) 100 mg twice a day.  Use MiraLax (over the counter) for constipation as needed.   Diet - low  sodium heart healthy    Complete by:  As directed    Increase activity slowly as tolerated    Complete by:  As directed          Signed: Mcarthur Rossetti 08/27/2016, 11:21 AM

## 2016-08-27 NOTE — Op Note (Signed)
NAMEBRYNLEY, Darlene Sanders                ACCOUNT NO.:  [id number]  MEDICAL RECORD NO.:  [id number]  LOCATION:                                 FACILITY:  PHYSICIAN:  Lind Guest. Ninfa Linden, M.D.DATE OF BIRTH:  11-21-1945  DATE OF PROCEDURE:  08/24/2016 DATE OF DISCHARGE:                              OPERATIVE REPORT   PREOPERATIVE DIAGNOSIS:  Primary osteoarthritis and degenerative joint disease, right hip.  POSTOPERATIVE DIAGNOSIS:  Primary osteoarthritis and degenerative joint disease, right hip.  PROCEDURE:  Right total hip arthroplasty through direct anterior approach.  IMPLANTS:  DePuy Sector Gription acetabular component, size 56 with a single screw, size 36+ 0 neutral polyethylene liner, size 11 Corail femoral component with standard offset, size 36+ 1.5 ceramic hip ball.  SURGEON:  Lind Guest. Ninfa Linden, MD.  ASSISTANT:  Erskine Emery, PA-C.  ANESTHESIA:  Spinal.  ANTIBIOTICS:  900 mg of IV clindamycin.  BLOOD LOSS:  100 mL.  COMPLICATIONS:  None.  INDICATIONS:  Ms. Ball is a 71 year old female well known to me.  She has debilitating arthritis involving her right hip.  It has now detrimentally affected her activities of daily living, her quality of life, and her mobility.  At this point, she does wish to proceed with a total hip arthroplasty on her right hip given the failure of conservative treatment.  She understands our goals are to decrease pain, improved mobility, and overall improve quality of life.  She understands the risk of acute blood loss anemia, nerve and vessel injury, fracture, infection, and dislocation.  PROCEDURE DESCRIPTION:  After informed consent was obtained, appropriate right hip was marked, she was brought to the operating room, where while she was on her stretcher spinal anesthesia was obtained.  She was laid in the supine position on a stretcher.  A Foley catheter was placed, and then we checked her leg length, and she is definitely  shorter on the right than the left.  This correlated with her preoperative x-rays.  We placed traction boots on both her feet and then placed her supine on the Hana fracture table with the perineal post in place and both legs in inline skeletal traction devices, but no traction applied.  Her right operative hip was then prepped and draped with DuraPrep and sterile drapes.  A time-out was called.  She was identified as correct patient and correct right hip.  We then made an incision just inferior and posterior to the anterior superior iliac spine and carried this obliquely down the leg.  We dissected down the tensor fascia lata muscle.  The tensor fascia was then divided longitudinally to proceed with a direct anterior approach to the hip.  We identified and cauterized circumflex vessels and identified the hip capsule in L-type format finding a large joint effusion and significant periarticular osteophytes.  We placed Cobra retractors around the medial and lateral femoral neck, and then made our femoral neck cut with an oscillating saw proximal to the lesser trochanter and completed this with an osteotome. We placed a corkscrew guide in the femoral head and removed the femoral head in its entirety and found to be completely devoid of cartilage and quite a large femoral  head for a petite person.  We then cleaned the acetabulum and remnants of the acetabular labrum and other debris.  I placed a bent Hohmann over the medial acetabular rim and then began reaming under direct visualization from a size 43 reamer in stepwise increments up to a size 56.  With all reamers under direct visualization, the last 2 reamers under direct fluoroscopy so I could obtain my depth of reaming, our inclination, and anteversion.  Once I was pleased with this, I placed the real DePuy Sector Gription acetabular component size 56 and a single screw.  We placed a 36+ 0 polyethylene liner for that size acetabular  component.  Attention was then turned to the femur.  With the leg externally rotated to 120 degrees, extended, and adducted, we were able to place a Mueller retractor medially and a Hohmann retractor behind the greater trochanter.  We released lateral joint capsule and used a box cutting osteotome, the inner femoral canal, and a rongeur to lateralize.  We then began broaching from a size 8 broach using the Corail broaching system up to a size 12.  The size 12 felt like it had a nice fit to it, so we trialed a standard offset femoral neck and a 36+ 1.5 hip ball.  We brought the leg back over and up with traction and internal rotation reducing the pelvis.  We were pleased with leg length, offset, and range of motion as well as stability.  We then dislocated the hip and removed the trial components.  We were able to place the real Corail femoral component size 11 with standard offset and the real 36+ 1.5 ceramic hip ball, reduced this in the acetabulum, and again it was stable.  We irrigated the tissues with normal saline solution and closed the joint capsule with interrupted #1 Ethibond suture followed by running #1 Vicryl to tensor fascia, 0 Vicryl in the deep tissue, 2-0 Vicryl in the subcutaneous tissue, 4-0 Monocryl subcuticular stitch, and Steri-Strips on the skin.  An Aquacel dressing was applied.  She was taken off the Hana table, taken to the recovery room in stable condition.  All final counts were correct.  There were no complications noted.  Of note, Erskine Emery, PA-C assisted in the entire case.  His assistance was crucial for facilitating all aspects of this case.     Lind Guest. Ninfa Linden, M.D.   ______________________________ Lind Guest. Ninfa Linden, M.D.    CYB/MEDQ  D:  08/24/2016  T:  08/25/2016  Job:  [id number]

## 2016-08-28 ENCOUNTER — Telehealth (INDEPENDENT_AMBULATORY_CARE_PROVIDER_SITE_OTHER): Payer: Self-pay

## 2016-08-28 NOTE — Telephone Encounter (Signed)
Patient states she is doing great and doesn't feel she needs therapy either

## 2016-08-28 NOTE — Telephone Encounter (Signed)
Darlene Sanders from [place] states she "refused" treatment, said Dr. Ninfa Linden told her she didn't need therapy?

## 2016-08-28 NOTE — Telephone Encounter (Signed)
Ok.  I told her she wouldn't need outpatient PT.

## 2016-09-04 ENCOUNTER — Telehealth (INDEPENDENT_AMBULATORY_CARE_PROVIDER_SITE_OTHER): Payer: Self-pay

## 2016-09-04 NOTE — Telephone Encounter (Signed)
Patient states she has a BM multiple times a day since surgery. She is wondering if this a normal thing?

## 2016-09-05 NOTE — Telephone Encounter (Signed)
Not usually normal.  Should stop any stool softners

## 2016-09-05 NOTE — Telephone Encounter (Signed)
LMOM for patient of the below message from CB 

## 2016-09-06 ENCOUNTER — Ambulatory Visit (INDEPENDENT_AMBULATORY_CARE_PROVIDER_SITE_OTHER): Payer: Medicare Other | Admitting: Orthopaedic Surgery

## 2016-09-06 DIAGNOSIS — Z96641 Presence of right artificial hip joint: Secondary | ICD-10-CM

## 2016-09-06 NOTE — Progress Notes (Signed)
The patient is now 2 weeks status post a right total hip replacement through direct injury approach. She is doing well. She family just a cane. She's finished therapy. She's back down to her aspirin once a day.  On examination her incision looks great. She does have a large seroma and I drained at least 90 mL of fluid off of her superficial tissue around her hip. She her leg lengths are equal. She's tolerating range of motion easily.  At this point she'll continue increase her activities. We can always drainage from again if it re-cannulates. She is going try ICE and heat. She'll limit some of her physical activities but really have no restrictions for her other than no high impact aerobic activities. We'll see her back in a month to see how she doing overall.

## 2016-09-07 DIAGNOSIS — Z96642 Presence of left artificial hip joint: Secondary | ICD-10-CM | POA: Diagnosis not present

## 2016-09-07 DIAGNOSIS — R195 Other fecal abnormalities: Secondary | ICD-10-CM | POA: Diagnosis not present

## 2016-09-18 ENCOUNTER — Encounter (INDEPENDENT_AMBULATORY_CARE_PROVIDER_SITE_OTHER): Payer: Self-pay | Admitting: Orthopaedic Surgery

## 2016-09-18 ENCOUNTER — Ambulatory Visit (INDEPENDENT_AMBULATORY_CARE_PROVIDER_SITE_OTHER): Payer: Medicare Other | Admitting: Physician Assistant

## 2016-09-18 DIAGNOSIS — Z96641 Presence of right artificial hip joint: Secondary | ICD-10-CM

## 2016-09-18 NOTE — Progress Notes (Signed)
   Office Visit Note   Patient: Darlene Sanders           Date of Birth: 1946-03-01           MRN: [id number] Visit Date: 09/18/2016              Requested by: Cari Caraway, MD [place], [place] [address] PCP: Cari Caraway, MD   Assessment & Plan: Visit Diagnoses:  1. Status post total replacement of right hip     Plan: Follow-up with her regular scheduled appointment. Encouraged her to do scar tissue mobilization and moist heat to the incision site. The remnant was drained today after prep with Betadine and ethyl chloride total of 85 mL of serosanguineous fluid removed.  Follow-Up Instructions: Return in about 20 days (around 10/08/2016) for wound check, post op.   Orders:  No orders of the defined types were placed in this encounter.  No orders of the defined types were placed in this encounter.     Procedures: No procedures performed   Clinical Data: No additional findings.   Subjective: Chief Complaint  Patient presents with  . Right Hip - Routine Post Op  . Follow-up    HPI Darlene Sanders is doing well very happy with the right hip just uncomfortable where she has a seroma. States that it is affecting her going up and down steps and walking. Review of Systems   Objective: Vital Signs: There were no vitals taken for this visit.  Physical Exam  Ortho Exam Excellent range of motion of the right hip without pain. Surgical incisions healing well no signs of infection. She does have a seroma. Specialty Comments:  No specialty comments available.  Imaging: No results found.   PMFS History: Patient Active Problem List   Diagnosis Date Noted  . Unilateral primary osteoarthritis, right hip 08/24/2016  . Status post total replacement of right hip 08/24/2016   Past Medical History:  Diagnosis Date  . Arthritis    osteoarthiritis related  . Asthma    many years ago, age 75  . Cancer [facility]) 2012   facial melanoma removal ; MOHs  .  Depression   . Diverticulitis   . Headache   . Hypertension   . Hypothyroidism     No family history on file.  Past Surgical History:  Procedure Laterality Date  . ABDOMINAL PERINEAL BOWEL RESECTION  2007  . COLONOSCOPY  2017  . [place]  2008  . TONSILLECTOMY     years ago, childhood  . TOTAL HIP ARTHROPLASTY Right 08/24/2016   Procedure: RIGHT TOTAL HIP ARTHROPLASTY ANTERIOR APPROACH;  Surgeon: Mcarthur Rossetti, MD;  Location: [facility];  Service: Orthopedics;  Laterality: Right;   Social History   Occupational History  . Not on file.   Social History Main Topics  . Smoking status: Current Every Day Smoker    Packs/day: 0.25    Years: 15.00    Types: Cigarettes  . Smokeless tobacco: Never Used  . Alcohol use Yes     Comment: social drinker  . Drug use: No  . Sexual activity: No

## 2016-09-25 DIAGNOSIS — L719 Rosacea, unspecified: Secondary | ICD-10-CM | POA: Diagnosis not present

## 2016-09-25 DIAGNOSIS — L720 Epidermal cyst: Secondary | ICD-10-CM | POA: Diagnosis not present

## 2016-09-25 DIAGNOSIS — Z23 Encounter for immunization: Secondary | ICD-10-CM | POA: Diagnosis not present

## 2016-09-25 DIAGNOSIS — Z87898 Personal history of other specified conditions: Secondary | ICD-10-CM | POA: Diagnosis not present

## 2016-09-25 DIAGNOSIS — D225 Melanocytic nevi of trunk: Secondary | ICD-10-CM | POA: Diagnosis not present

## 2016-09-25 DIAGNOSIS — L821 Other seborrheic keratosis: Secondary | ICD-10-CM | POA: Diagnosis not present

## 2016-10-08 ENCOUNTER — Ambulatory Visit (INDEPENDENT_AMBULATORY_CARE_PROVIDER_SITE_OTHER): Payer: Medicare Other | Admitting: Orthopaedic Surgery

## 2016-10-08 DIAGNOSIS — Z96641 Presence of right artificial hip joint: Secondary | ICD-10-CM

## 2016-10-08 NOTE — Progress Notes (Signed)
The patient is now 6 weeks status post a right total hip arthroplasty. She says she is doing great and has no pain.  On exam she does still have a small seroma. I cleaned this area with Betadine and alcohol withdrew 30 mL of seroma fluid from her hip. She said this really was she will have done. She is doing great overall. Range of motion is excellent. She has some subjective numbness around her incision to be expected and there is no evidence infection. Her leg was are equal. She is walking without an assistive device.  At this point she'll continue increase her activities. We'll see her back in a months as she doing overall.

## 2016-10-24 DIAGNOSIS — Z1231 Encounter for screening mammogram for malignant neoplasm of breast: Secondary | ICD-10-CM | POA: Diagnosis not present

## 2016-11-05 ENCOUNTER — Ambulatory Visit (INDEPENDENT_AMBULATORY_CARE_PROVIDER_SITE_OTHER): Payer: Medicare Other | Admitting: Orthopaedic Surgery

## 2016-11-05 ENCOUNTER — Encounter (INDEPENDENT_AMBULATORY_CARE_PROVIDER_SITE_OTHER): Payer: Self-pay

## 2016-11-05 DIAGNOSIS — Z96641 Presence of right artificial hip joint: Secondary | ICD-10-CM

## 2016-11-05 NOTE — Progress Notes (Signed)
The patient is getting close to 3 months status post a right total hip arthroplasty through direct injury approach. She has some problems just muscle being swelling and a "nerve feeling" but overall though she is doing great. She can put on her shoes and socks she states. She has no complaints otherwise. She did she's not taking any medications and does not need any.  On examination she doesn't put her hip easily through range of motion. She is subjective numbness around the incision but no significant swelling. There is a little bit of pain over the IT band. Her leg lengths are equal.  At this point we'll see her back in 6 months to see how she doing overall. We'll have a low AP pelvis and lateral of her right hip at that visit.

## 2016-11-28 DIAGNOSIS — F325 Major depressive disorder, single episode, in full remission: Secondary | ICD-10-CM | POA: Diagnosis not present

## 2016-11-28 DIAGNOSIS — Z119 Encounter for screening for infectious and parasitic diseases, unspecified: Secondary | ICD-10-CM | POA: Diagnosis not present

## 2016-11-28 DIAGNOSIS — Z1159 Encounter for screening for other viral diseases: Secondary | ICD-10-CM | POA: Diagnosis not present

## 2016-11-28 DIAGNOSIS — E039 Hypothyroidism, unspecified: Secondary | ICD-10-CM | POA: Diagnosis not present

## 2016-11-28 DIAGNOSIS — I10 Essential (primary) hypertension: Secondary | ICD-10-CM | POA: Diagnosis not present

## 2016-11-28 DIAGNOSIS — R801 Persistent proteinuria, unspecified: Secondary | ICD-10-CM | POA: Diagnosis not present

## 2016-11-28 DIAGNOSIS — Z0000 Encounter for general adult medical examination without abnormal findings: Secondary | ICD-10-CM | POA: Diagnosis not present

## 2016-11-28 DIAGNOSIS — N182 Chronic kidney disease, stage 2 (mild): Secondary | ICD-10-CM | POA: Diagnosis not present

## 2016-11-28 DIAGNOSIS — E559 Vitamin D deficiency, unspecified: Secondary | ICD-10-CM | POA: Diagnosis not present

## 2016-11-28 DIAGNOSIS — J309 Allergic rhinitis, unspecified: Secondary | ICD-10-CM | POA: Diagnosis not present

## 2016-11-28 DIAGNOSIS — N83201 Unspecified ovarian cyst, right side: Secondary | ICD-10-CM | POA: Diagnosis not present

## 2016-11-28 DIAGNOSIS — E782 Mixed hyperlipidemia: Secondary | ICD-10-CM | POA: Diagnosis not present

## 2016-11-29 DIAGNOSIS — N83202 Unspecified ovarian cyst, left side: Secondary | ICD-10-CM | POA: Diagnosis not present

## 2016-11-30 DIAGNOSIS — Z0000 Encounter for general adult medical examination without abnormal findings: Secondary | ICD-10-CM | POA: Diagnosis not present

## 2016-11-30 DIAGNOSIS — Z7189 Other specified counseling: Secondary | ICD-10-CM | POA: Diagnosis not present

## 2016-11-30 DIAGNOSIS — Z23 Encounter for immunization: Secondary | ICD-10-CM | POA: Diagnosis not present

## 2016-11-30 DIAGNOSIS — Z1211 Encounter for screening for malignant neoplasm of colon: Secondary | ICD-10-CM | POA: Diagnosis not present

## 2016-12-12 IMAGING — US US SOFT TISSUE HEAD/NECK
1 series · 14 of 25 positions shown · non-contrast
Comparison: None.

CLINICAL DATA: 68-YEAR-OLD FEMALE WITH A HISTORY OF THYROID NODULE.

EXAM:
THYROID ULTRASOUND
TECHNIQUE: Ultrasound examination of the thyroid gland and adjacent soft
tissues was performed.

[Series 1: us soft tissue head/neck · 0.06mm/px · 14 of 40 slices shown]
[im 1/40]
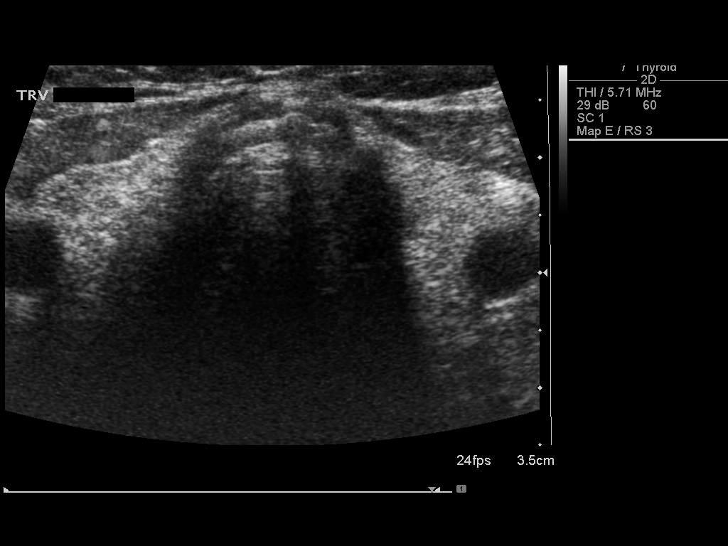
[im 4/40]
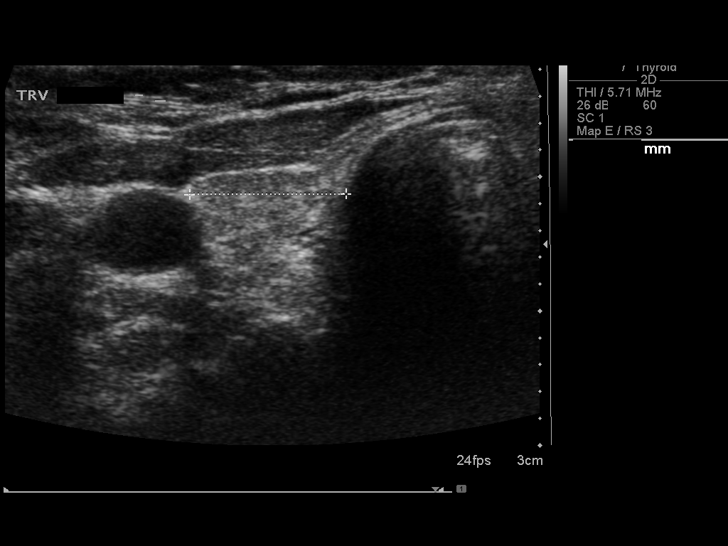
[im 7/40]
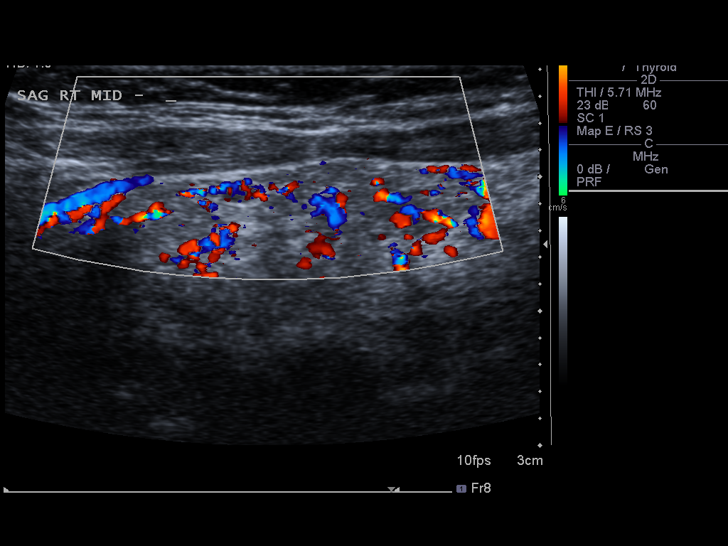
[im 10/40]
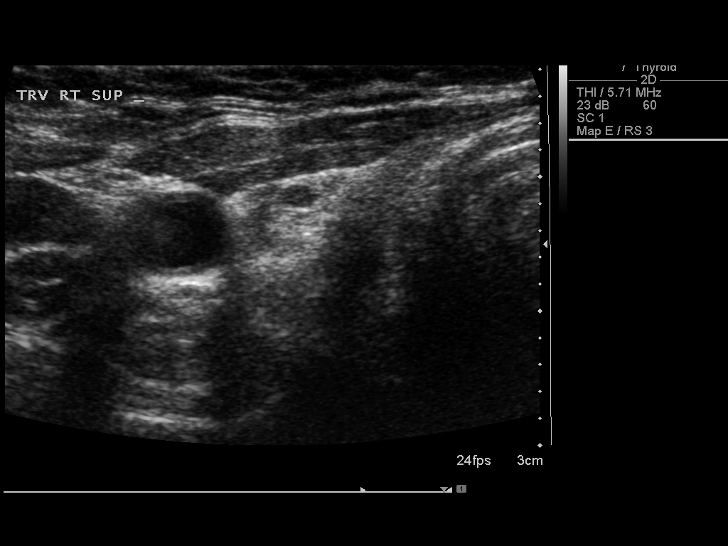
[im 14/40]
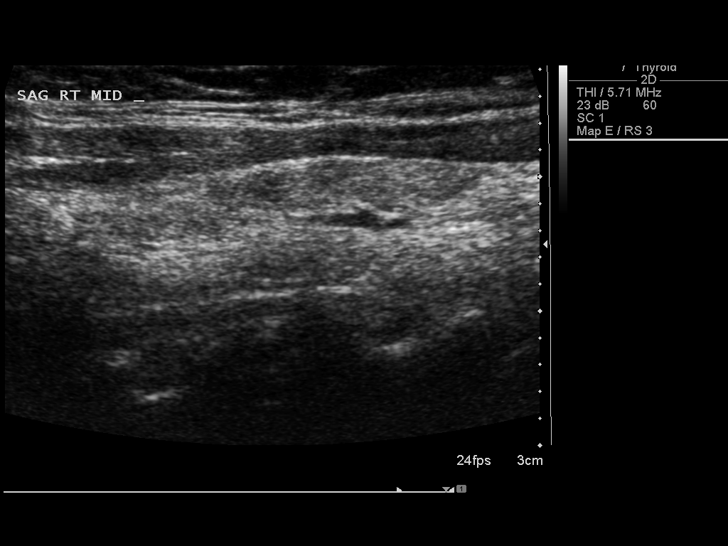
[im 15/40]
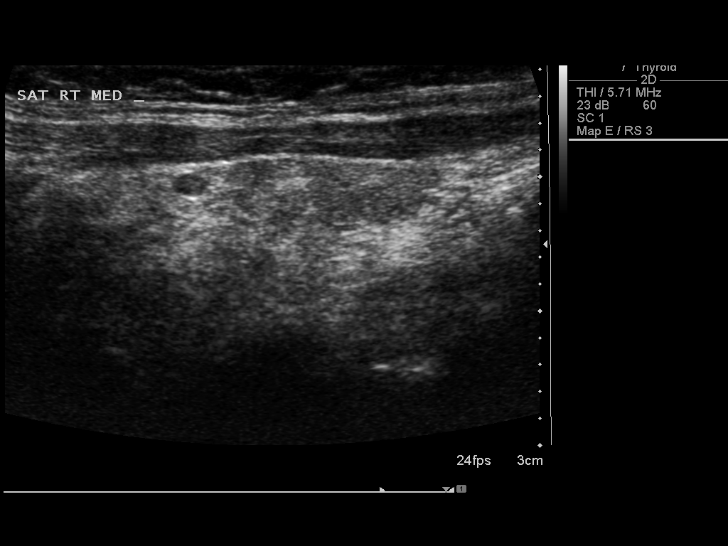
[im 18/40]
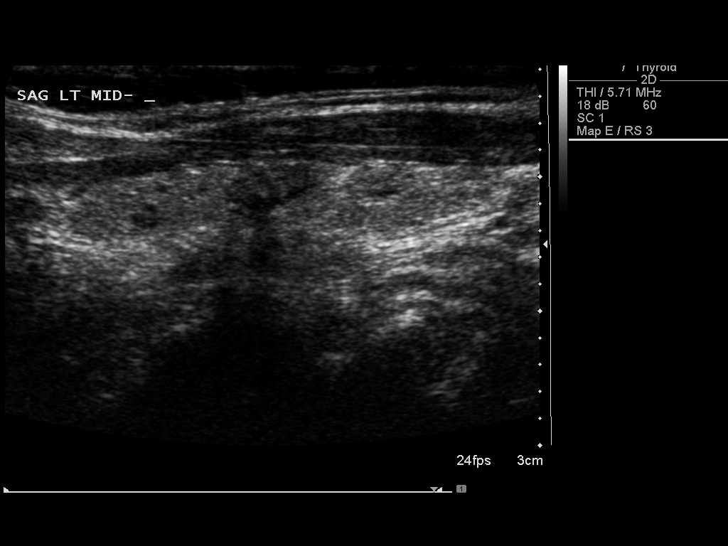
[im 22/40]
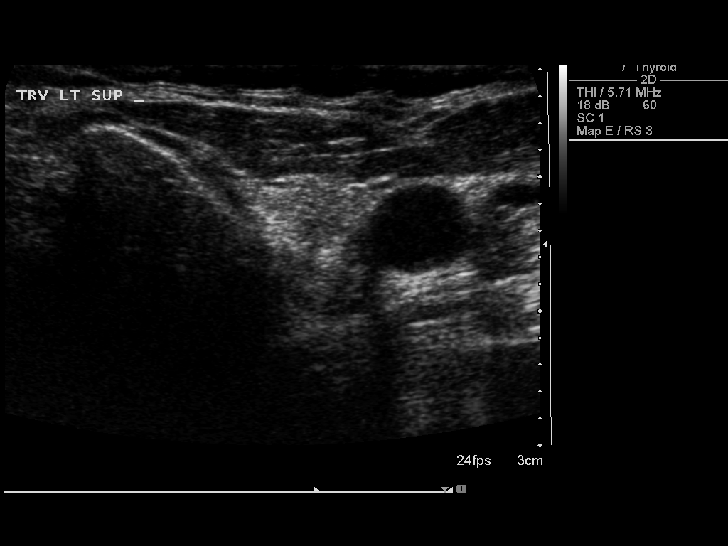
[im 25/40]
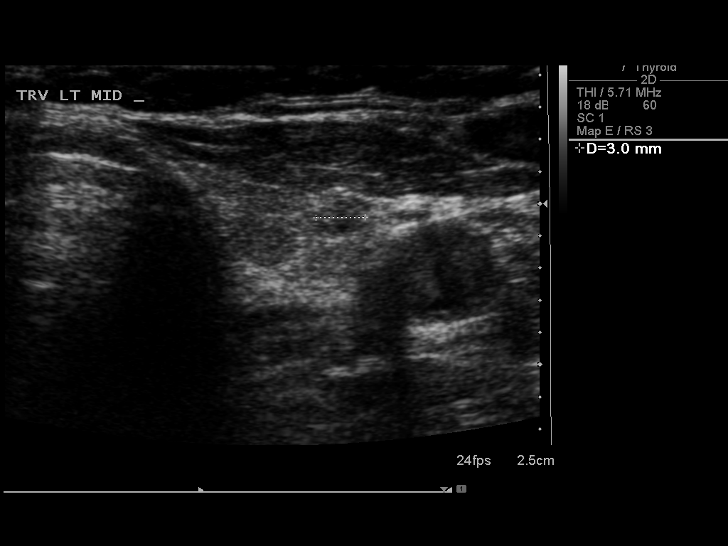
[im 27/40]
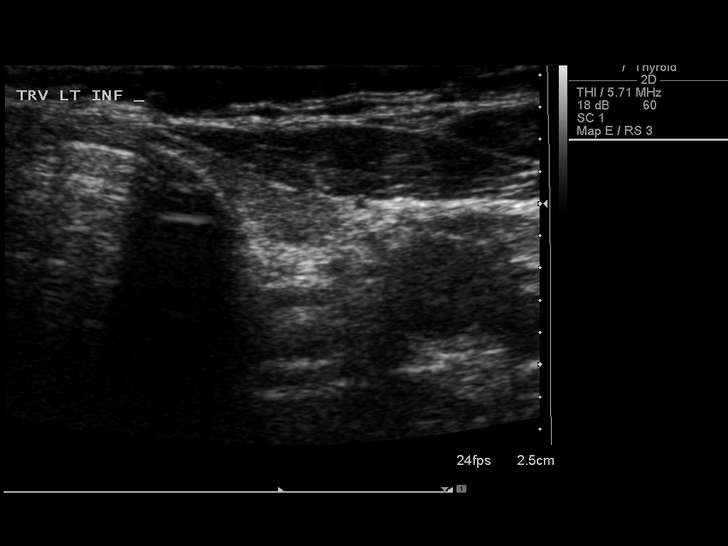
[im 30/40]
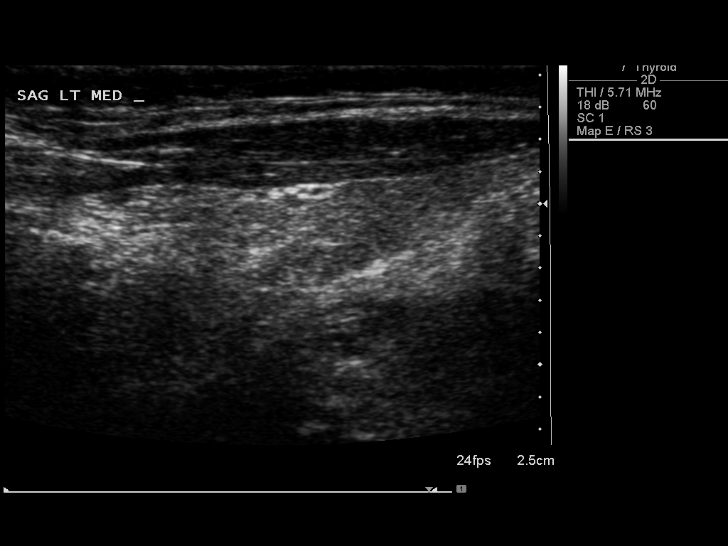
[im 33/40]
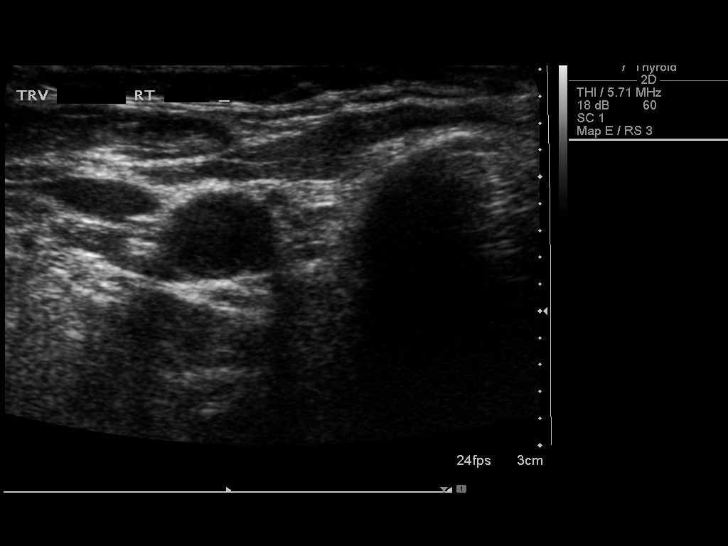
[im 36/40]
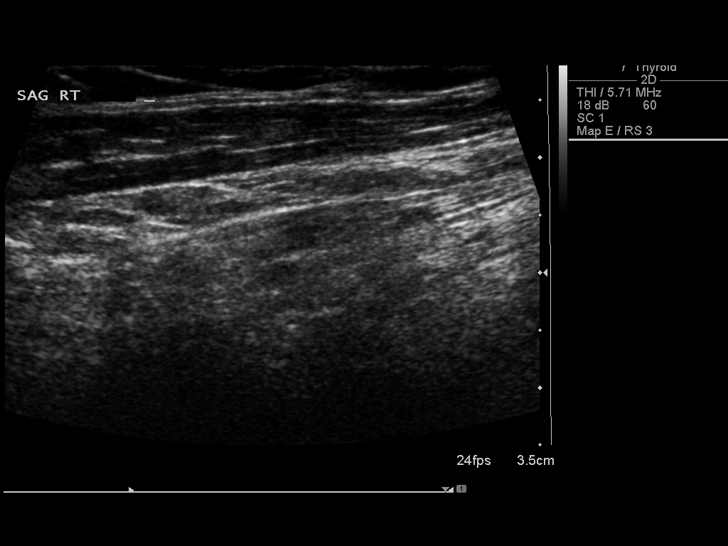
[im 40/40]
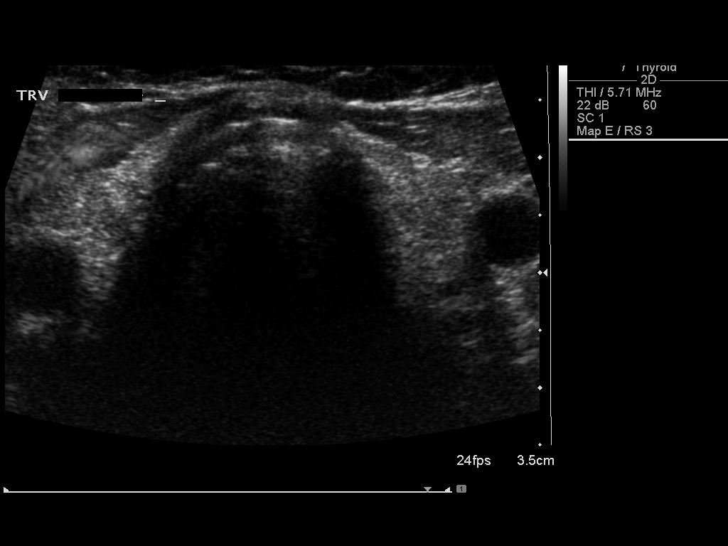

[14 of 25 positions shown; findings below may reference images not displayed]

FINDINGS: Right thyroid lobe

Measurements: 3.1 cm x 5 mm x 1.2 cm. Heterogeneous thyroid tissue
with no significant increased flow. 2 mm hypoechoic nodule
superiorly.

Left thyroid lobe

Measurements: 3.3 cm x 6 mm x 8 mm. Heterogeneous thyroid tissue.
Multiple small sub cm nodules, none of which measure greater than 5
mm.

Isthmus

Thickness: 2 mm.  No nodules visualized.

Lymphadenopathy

None visualized.
IMPRESSION: Multinodular thyroid with no new nodule meeting criteria for biopsy.

Follow-up by clinical exam is recommended. If patient has known risk
factors for thyroid carcinoma, consider follow-up ultrasound in 12
months. If patient is clinically hyperthyroid, consider nuclear
medicine thyroid uptake and scan.Reference: Management of Thyroid
Nodules Detected at US: Society of Radiologists in Ultrasound

## 2016-12-13 DIAGNOSIS — J309 Allergic rhinitis, unspecified: Secondary | ICD-10-CM | POA: Diagnosis not present

## 2016-12-13 DIAGNOSIS — E782 Mixed hyperlipidemia: Secondary | ICD-10-CM | POA: Diagnosis not present

## 2016-12-13 DIAGNOSIS — I10 Essential (primary) hypertension: Secondary | ICD-10-CM | POA: Diagnosis not present

## 2016-12-13 DIAGNOSIS — N182 Chronic kidney disease, stage 2 (mild): Secondary | ICD-10-CM | POA: Diagnosis not present

## 2016-12-13 DIAGNOSIS — R801 Persistent proteinuria, unspecified: Secondary | ICD-10-CM | POA: Diagnosis not present

## 2016-12-13 DIAGNOSIS — F325 Major depressive disorder, single episode, in full remission: Secondary | ICD-10-CM | POA: Diagnosis not present

## 2016-12-13 DIAGNOSIS — E559 Vitamin D deficiency, unspecified: Secondary | ICD-10-CM | POA: Diagnosis not present

## 2016-12-13 DIAGNOSIS — N83201 Unspecified ovarian cyst, right side: Secondary | ICD-10-CM | POA: Diagnosis not present

## 2016-12-13 DIAGNOSIS — E039 Hypothyroidism, unspecified: Secondary | ICD-10-CM | POA: Diagnosis not present

## 2017-03-11 DIAGNOSIS — B399 Histoplasmosis, unspecified: Secondary | ICD-10-CM | POA: Diagnosis not present

## 2017-05-08 ENCOUNTER — Ambulatory Visit (INDEPENDENT_AMBULATORY_CARE_PROVIDER_SITE_OTHER): Payer: Medicare Other | Admitting: Orthopaedic Surgery

## 2017-05-08 ENCOUNTER — Ambulatory Visit (INDEPENDENT_AMBULATORY_CARE_PROVIDER_SITE_OTHER): Payer: Medicare Other

## 2017-05-08 DIAGNOSIS — Z96641 Presence of right artificial hip joint: Secondary | ICD-10-CM | POA: Diagnosis not present

## 2017-05-08 NOTE — Progress Notes (Signed)
The patient is now 8 months status post a right total hip arthroplasty through direct anterior approach. She is doing excellent she states. She 71 years old. She walks without limp. She has no significant concerns. She does want to take a look of the incision.  On exam his incision looks normal. She has some slight numbness around it. There is just some swelling posterior the incision but is more from muscle and the trauma to the soft tissues that will dissipate with time and she understands this. She has excellent full range of motion of her hip. She's happy that she can put her socks and shoes on easily at this point. Her leg lengths are equal as well.  An AP pelvis and lateral of her right hip shows a well-seated implant with no, getting features.  At this point she'll follow-up as needed. All questions concerns were addressed and answered. I told her things at would need to bring her back for that hip if there is any issues. She understands we can see her for anything else as well.

## 2017-06-12 DIAGNOSIS — F325 Major depressive disorder, single episode, in full remission: Secondary | ICD-10-CM | POA: Diagnosis not present

## 2017-06-12 DIAGNOSIS — E559 Vitamin D deficiency, unspecified: Secondary | ICD-10-CM | POA: Diagnosis not present

## 2017-06-12 DIAGNOSIS — N83201 Unspecified ovarian cyst, right side: Secondary | ICD-10-CM | POA: Diagnosis not present

## 2017-06-12 DIAGNOSIS — E039 Hypothyroidism, unspecified: Secondary | ICD-10-CM | POA: Diagnosis not present

## 2017-06-12 DIAGNOSIS — J309 Allergic rhinitis, unspecified: Secondary | ICD-10-CM | POA: Diagnosis not present

## 2017-06-12 DIAGNOSIS — R801 Persistent proteinuria, unspecified: Secondary | ICD-10-CM | POA: Diagnosis not present

## 2017-06-12 DIAGNOSIS — I10 Essential (primary) hypertension: Secondary | ICD-10-CM | POA: Diagnosis not present

## 2017-06-12 DIAGNOSIS — E782 Mixed hyperlipidemia: Secondary | ICD-10-CM | POA: Diagnosis not present

## 2017-06-12 DIAGNOSIS — N182 Chronic kidney disease, stage 2 (mild): Secondary | ICD-10-CM | POA: Diagnosis not present

## 2017-06-17 DIAGNOSIS — E782 Mixed hyperlipidemia: Secondary | ICD-10-CM | POA: Diagnosis not present

## 2017-06-17 DIAGNOSIS — Z23 Encounter for immunization: Secondary | ICD-10-CM | POA: Diagnosis not present

## 2017-06-17 DIAGNOSIS — N83201 Unspecified ovarian cyst, right side: Secondary | ICD-10-CM | POA: Diagnosis not present

## 2017-06-17 DIAGNOSIS — E039 Hypothyroidism, unspecified: Secondary | ICD-10-CM | POA: Diagnosis not present

## 2017-06-17 DIAGNOSIS — J309 Allergic rhinitis, unspecified: Secondary | ICD-10-CM | POA: Diagnosis not present

## 2017-06-17 DIAGNOSIS — N182 Chronic kidney disease, stage 2 (mild): Secondary | ICD-10-CM | POA: Diagnosis not present

## 2017-06-17 DIAGNOSIS — R801 Persistent proteinuria, unspecified: Secondary | ICD-10-CM | POA: Diagnosis not present

## 2017-06-17 DIAGNOSIS — Z8739 Personal history of other diseases of the musculoskeletal system and connective tissue: Secondary | ICD-10-CM | POA: Diagnosis not present

## 2017-06-17 DIAGNOSIS — F325 Major depressive disorder, single episode, in full remission: Secondary | ICD-10-CM | POA: Diagnosis not present

## 2017-06-17 DIAGNOSIS — I10 Essential (primary) hypertension: Secondary | ICD-10-CM | POA: Diagnosis not present

## 2017-06-17 DIAGNOSIS — E559 Vitamin D deficiency, unspecified: Secondary | ICD-10-CM | POA: Diagnosis not present

## 2017-09-26 DIAGNOSIS — L821 Other seborrheic keratosis: Secondary | ICD-10-CM | POA: Diagnosis not present

## 2017-09-26 DIAGNOSIS — D225 Melanocytic nevi of trunk: Secondary | ICD-10-CM | POA: Diagnosis not present

## 2017-09-26 DIAGNOSIS — Z23 Encounter for immunization: Secondary | ICD-10-CM | POA: Diagnosis not present

## 2017-09-26 DIAGNOSIS — L719 Rosacea, unspecified: Secondary | ICD-10-CM | POA: Diagnosis not present

## 2017-09-26 DIAGNOSIS — Z87898 Personal history of other specified conditions: Secondary | ICD-10-CM | POA: Diagnosis not present

## 2017-09-26 DIAGNOSIS — L820 Inflamed seborrheic keratosis: Secondary | ICD-10-CM | POA: Diagnosis not present

## 2017-11-29 DIAGNOSIS — J309 Allergic rhinitis, unspecified: Secondary | ICD-10-CM | POA: Diagnosis not present

## 2017-11-29 DIAGNOSIS — J069 Acute upper respiratory infection, unspecified: Secondary | ICD-10-CM | POA: Diagnosis not present

## 2017-12-02 DIAGNOSIS — I10 Essential (primary) hypertension: Secondary | ICD-10-CM | POA: Diagnosis not present

## 2017-12-02 DIAGNOSIS — E782 Mixed hyperlipidemia: Secondary | ICD-10-CM | POA: Diagnosis not present

## 2017-12-02 DIAGNOSIS — N182 Chronic kidney disease, stage 2 (mild): Secondary | ICD-10-CM | POA: Diagnosis not present

## 2017-12-02 DIAGNOSIS — R05 Cough: Secondary | ICD-10-CM | POA: Diagnosis not present

## 2017-12-02 DIAGNOSIS — R801 Persistent proteinuria, unspecified: Secondary | ICD-10-CM | POA: Diagnosis not present

## 2017-12-02 DIAGNOSIS — J309 Allergic rhinitis, unspecified: Secondary | ICD-10-CM | POA: Diagnosis not present

## 2017-12-02 DIAGNOSIS — F325 Major depressive disorder, single episode, in full remission: Secondary | ICD-10-CM | POA: Diagnosis not present

## 2017-12-02 DIAGNOSIS — E559 Vitamin D deficiency, unspecified: Secondary | ICD-10-CM | POA: Diagnosis not present

## 2017-12-02 DIAGNOSIS — Z1211 Encounter for screening for malignant neoplasm of colon: Secondary | ICD-10-CM | POA: Diagnosis not present

## 2017-12-02 DIAGNOSIS — Z0000 Encounter for general adult medical examination without abnormal findings: Secondary | ICD-10-CM | POA: Diagnosis not present

## 2017-12-02 DIAGNOSIS — Z79899 Other long term (current) drug therapy: Secondary | ICD-10-CM | POA: Diagnosis not present

## 2017-12-02 DIAGNOSIS — E039 Hypothyroidism, unspecified: Secondary | ICD-10-CM | POA: Diagnosis not present

## 2017-12-03 DIAGNOSIS — S41111A Laceration without foreign body of right upper arm, initial encounter: Secondary | ICD-10-CM | POA: Diagnosis not present

## 2018-01-16 DIAGNOSIS — L03119 Cellulitis of unspecified part of limb: Secondary | ICD-10-CM | POA: Diagnosis not present

## 2018-03-11 DIAGNOSIS — H32 Chorioretinal disorders in diseases classified elsewhere: Secondary | ICD-10-CM | POA: Diagnosis not present

## 2018-03-11 DIAGNOSIS — B399 Histoplasmosis, unspecified: Secondary | ICD-10-CM | POA: Diagnosis not present

## 2018-04-28 DIAGNOSIS — S86912A Strain of unspecified muscle(s) and tendon(s) at lower leg level, left leg, initial encounter: Secondary | ICD-10-CM | POA: Diagnosis not present

## 2018-04-28 DIAGNOSIS — W19XXXA Unspecified fall, initial encounter: Secondary | ICD-10-CM | POA: Diagnosis not present

## 2018-04-30 ENCOUNTER — Encounter (INDEPENDENT_AMBULATORY_CARE_PROVIDER_SITE_OTHER): Payer: Self-pay | Admitting: Orthopaedic Surgery

## 2018-04-30 ENCOUNTER — Ambulatory Visit (INDEPENDENT_AMBULATORY_CARE_PROVIDER_SITE_OTHER): Payer: Medicare Other | Admitting: Orthopaedic Surgery

## 2018-04-30 DIAGNOSIS — M25562 Pain in left knee: Secondary | ICD-10-CM

## 2018-04-30 MED ORDER — METHYLPREDNISOLONE ACETATE 40 MG/ML IJ SUSP
40.0000 mg | INTRAMUSCULAR | Status: AC | PRN
  Administered 2018-04-30: 40 mg via INTRA_ARTICULAR

## 2018-04-30 MED ORDER — LIDOCAINE HCL 1 % IJ SOLN
3.0000 mL | INTRAMUSCULAR | Status: AC | PRN
  Administered 2018-04-30: 3 mL

## 2018-04-30 NOTE — Progress Notes (Signed)
Office Visit Note   Patient: Darlene Sanders           Date of Birth: 22-Oct-1945           MRN: [id number] Visit Date: 04/30/2018              Requested by: Cari Caraway, [place], [place] [address] PCP: Cari Caraway, MD   Assessment & Plan: Visit Diagnoses:  1. Acute pain of left knee     Plan: Hopefully this is just a contusion for her knee.  I did try to take fluid off of her knee and place a steroid into her left knee.  She tolerated this well.  She can continue her cane in the opposite hand until she feels better overall.  I did give her reassurance that she is got great joint space in her left knee.  However she continues to have any pain and problems and is not getting better for a few weeks she will let us know because this is in the I would definitely obtain an MRI should continue to have pain.  Given her age of 72 certainly there could be a missed fracture in the bone.  Follow-Up Instructions: Return if symptoms worsen or fail to improve.   Orders:  Orders Placed This Encounter  Procedures  . Large Joint Inj   No orders of the defined types were placed in this encounter.     Procedures: Large Joint Inj: L knee on 04/30/2018 2:18 PM Indications: diagnostic evaluation and pain Details: 22 G 1.5 in needle, superolateral approach  Arthrogram: No  Medications: 3 mL lidocaine 1 %; 40 mg methylPREDNISolone acetate 40 MG/ML Outcome: tolerated well, no immediate complications Procedure, treatment alternatives, risks and benefits explained, specific risks discussed. Consent was given by the patient. Immediately prior to procedure a time out was called to verify the correct patient, procedure, equipment, support staff and site/side marked as required. Patient was prepped and draped in the usual sterile fashion.       Clinical Data: No additional findings.   Subjective: Chief Complaint  Patient presents with  . Left Knee - Pain  Patient comes in  today with acute left knee pain after mechanical fall.  She tripped from a couch after getting up on Sunday and fell injuring that knee.  She went to urgent care and they did obtain x-rays.  She is 20 months out from a right total hip arthroplasty and said that hip is doing well.  The left knee has been hurting and is swollen and she cannot bend it well.  HPI  Review of Systems She currently denies any headache, chest pain, shortness of breath, fever, chills, nausea, vomiting.  Objective: Vital Signs: There were no vitals taken for this visit.  Physical Exam She is alert and oriented x3 and in no acute distress Ortho Exam Examination of her left knee does show moderate effusion.  Her knee feels stable ligamentously and has good range of motion but is painful. Specialty Comments:  No specialty comments available.  Imaging: No results found. X-rays independently reviewed of her left knee show no obvious fracture.  The joint space is well-maintained.  PMFS History: Patient Active Problem List   Diagnosis Date Noted  . History of total hip arthroplasty, right 05/08/2017  . Unilateral primary osteoarthritis, right hip 08/24/2016  . Status post total replacement of right hip 08/24/2016   Past Medical History:  Diagnosis Date  . Arthritis  osteoarthiritis related  . Asthma    many years ago, age 72  . Cancer [facility]) 2012   facial melanoma removal ; MOHs  . Depression   . Diverticulitis   . Headache   . Hypertension   . Hypothyroidism     History reviewed. No pertinent family history.  Past Surgical History:  Procedure Laterality Date  . ABDOMINAL PERINEAL BOWEL RESECTION  2007  . COLONOSCOPY  2017  . [place]  2008  . TONSILLECTOMY     years ago, childhood  . TOTAL HIP ARTHROPLASTY Right 08/24/2016   Procedure: RIGHT TOTAL HIP ARTHROPLASTY ANTERIOR APPROACH;  Surgeon: Mcarthur Rossetti, MD;  Location: [facility];  Service: Orthopedics;  Laterality: Right;     Social History   Occupational History  . Not on file  Tobacco Use  . Smoking status: Current Every Day Smoker    Packs/day: 0.25    Years: 15.00    Pack years: 3.75    Types: Cigarettes  . Smokeless tobacco: Never Used  Substance and Sexual Activity  . Alcohol use: Yes    Comment: social drinker  . Drug use: No  . Sexual activity: Never

## 2018-05-01 ENCOUNTER — Ambulatory Visit (INDEPENDENT_AMBULATORY_CARE_PROVIDER_SITE_OTHER): Payer: Medicare Other | Admitting: Physician Assistant

## 2018-05-05 ENCOUNTER — Ambulatory Visit (INDEPENDENT_AMBULATORY_CARE_PROVIDER_SITE_OTHER): Payer: Medicare Other | Admitting: Orthopaedic Surgery

## 2018-06-03 DIAGNOSIS — Z1231 Encounter for screening mammogram for malignant neoplasm of breast: Secondary | ICD-10-CM | POA: Diagnosis not present

## 2018-06-04 DIAGNOSIS — E559 Vitamin D deficiency, unspecified: Secondary | ICD-10-CM | POA: Diagnosis not present

## 2018-06-04 DIAGNOSIS — I10 Essential (primary) hypertension: Secondary | ICD-10-CM | POA: Diagnosis not present

## 2018-06-04 DIAGNOSIS — R05 Cough: Secondary | ICD-10-CM | POA: Diagnosis not present

## 2018-06-04 DIAGNOSIS — J309 Allergic rhinitis, unspecified: Secondary | ICD-10-CM | POA: Diagnosis not present

## 2018-06-04 DIAGNOSIS — Z1211 Encounter for screening for malignant neoplasm of colon: Secondary | ICD-10-CM | POA: Diagnosis not present

## 2018-06-04 DIAGNOSIS — F325 Major depressive disorder, single episode, in full remission: Secondary | ICD-10-CM | POA: Diagnosis not present

## 2018-06-04 DIAGNOSIS — Z0000 Encounter for general adult medical examination without abnormal findings: Secondary | ICD-10-CM | POA: Diagnosis not present

## 2018-06-04 DIAGNOSIS — Z79899 Other long term (current) drug therapy: Secondary | ICD-10-CM | POA: Diagnosis not present

## 2018-06-04 DIAGNOSIS — E782 Mixed hyperlipidemia: Secondary | ICD-10-CM | POA: Diagnosis not present

## 2018-06-04 DIAGNOSIS — R801 Persistent proteinuria, unspecified: Secondary | ICD-10-CM | POA: Diagnosis not present

## 2018-06-04 DIAGNOSIS — E039 Hypothyroidism, unspecified: Secondary | ICD-10-CM | POA: Diagnosis not present

## 2018-06-04 DIAGNOSIS — N182 Chronic kidney disease, stage 2 (mild): Secondary | ICD-10-CM | POA: Diagnosis not present

## 2018-06-05 DIAGNOSIS — F325 Major depressive disorder, single episode, in full remission: Secondary | ICD-10-CM | POA: Diagnosis not present

## 2018-06-05 DIAGNOSIS — E559 Vitamin D deficiency, unspecified: Secondary | ICD-10-CM | POA: Diagnosis not present

## 2018-06-05 DIAGNOSIS — E782 Mixed hyperlipidemia: Secondary | ICD-10-CM | POA: Diagnosis not present

## 2018-06-05 DIAGNOSIS — E039 Hypothyroidism, unspecified: Secondary | ICD-10-CM | POA: Diagnosis not present

## 2018-06-05 DIAGNOSIS — Z23 Encounter for immunization: Secondary | ICD-10-CM | POA: Diagnosis not present

## 2018-06-05 DIAGNOSIS — R801 Persistent proteinuria, unspecified: Secondary | ICD-10-CM | POA: Diagnosis not present

## 2018-06-05 DIAGNOSIS — J309 Allergic rhinitis, unspecified: Secondary | ICD-10-CM | POA: Diagnosis not present

## 2018-06-05 DIAGNOSIS — I10 Essential (primary) hypertension: Secondary | ICD-10-CM | POA: Diagnosis not present

## 2018-06-05 DIAGNOSIS — N182 Chronic kidney disease, stage 2 (mild): Secondary | ICD-10-CM | POA: Diagnosis not present

## 2018-09-13 IMAGING — DX DG PORTABLE PELVIS
1 series · 1 of 1 positions shown · non-contrast
Comparison: 06/27/2016.

CLINICAL DATA: Status post right hip replacement.

EXAM:
PORTABLE PELVIS 1-2 VIEWS

[pelvis ap]
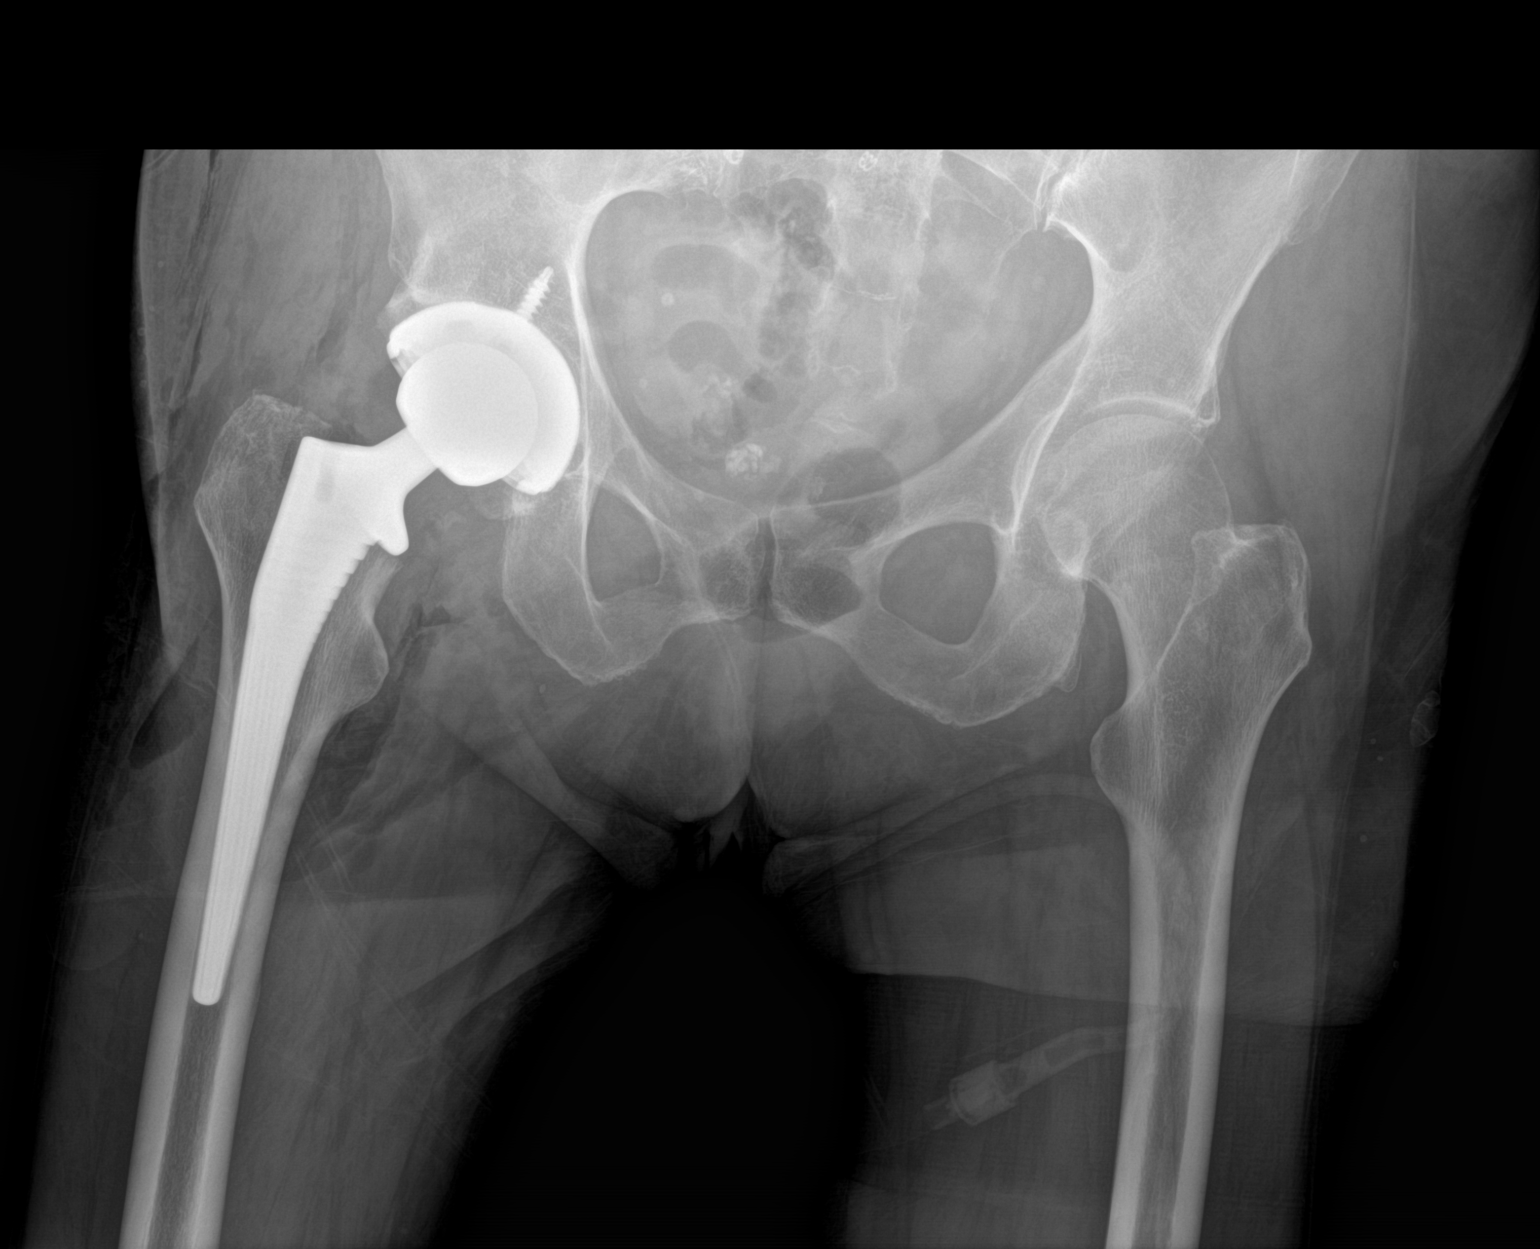

[1 of 1 positions shown; findings below may reference images not displayed]

FINDINGS: Interval right total hip prosthesis in satisfactory position and
alignment. No fracture or dislocation seen. Hernia repair mesh
springs. Small, calcified uterine fibroids.
IMPRESSION: Satisfactory postoperative appearance of a right total hip
prosthesis.

## 2018-09-13 IMAGING — RF DG HIP (WITH PELVIS) OPERATIVE*R*
1 series · 2 of 2 positions shown · non-contrast
Comparison: Radiographs dated 06/27/2016

CLINICAL DATA: Osteoarthritis of the right hip. Right total hip
replacement.

EXAM:
OPERATIVE RIGHT HIP (WITH PELVIS IF PERFORMED) 2 VIEWS
TECHNIQUE: Fluoroscopic spot image(s) were submitted for interpretation
post-operatively.

[Series 1: run · 2 of 2 slices shown]
[im 1/2]
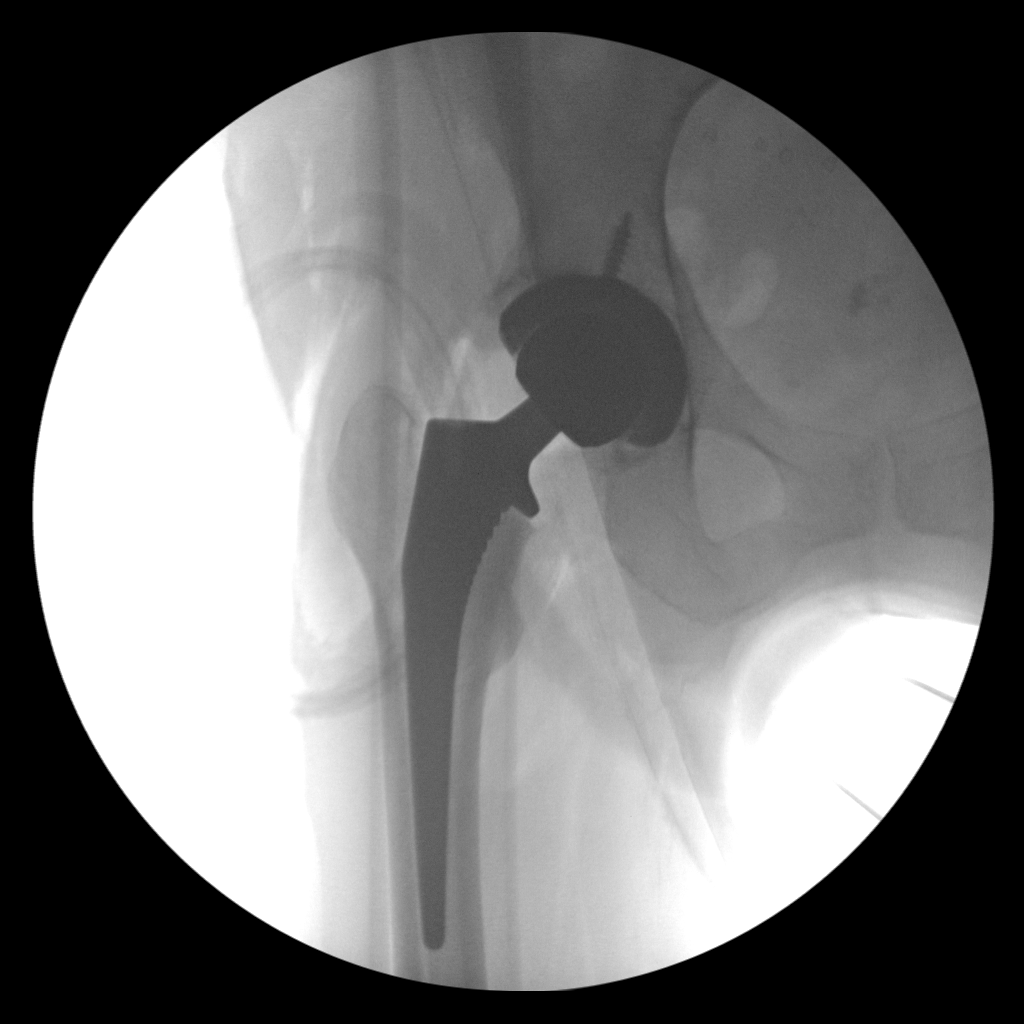
[im 2/2]
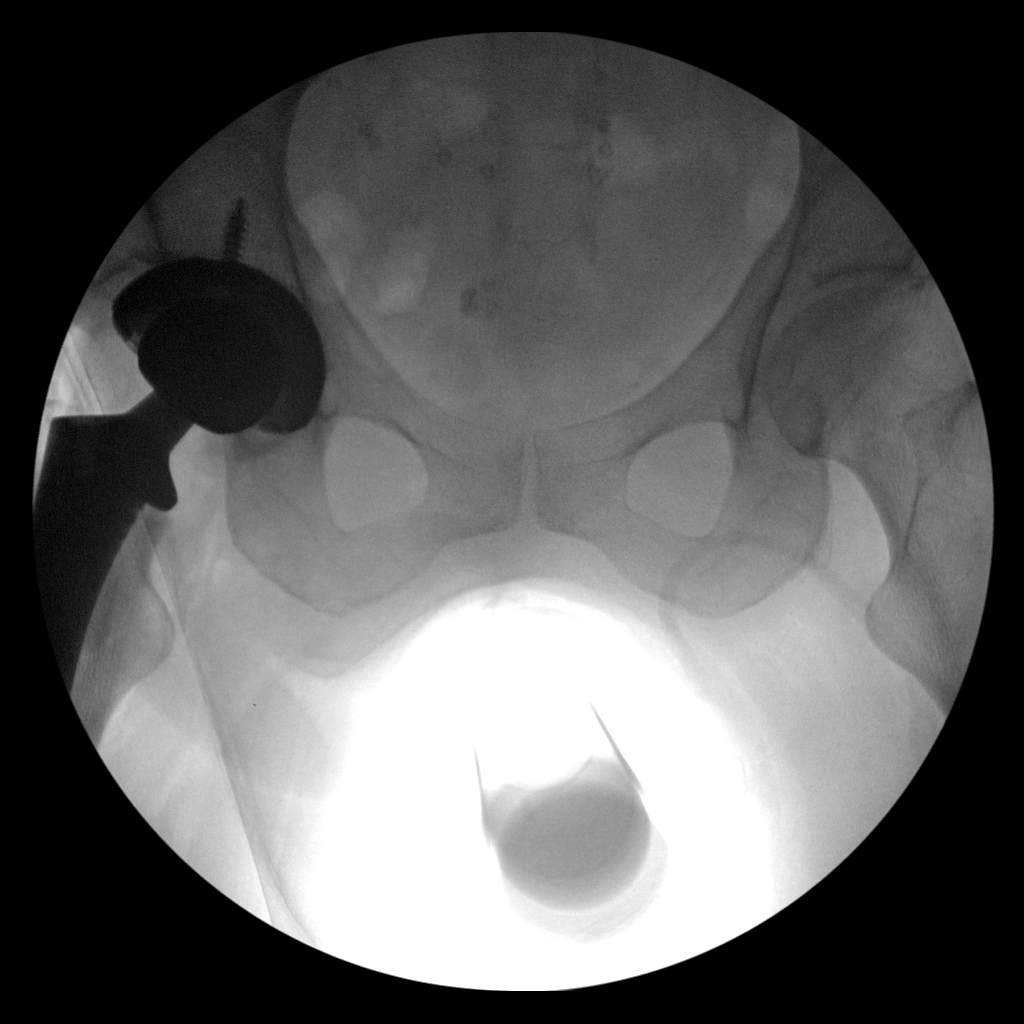

[2 of 2 positions shown; findings below may reference images not displayed]

FINDINGS: The patient has undergone right total hip prosthesis insertion. The
components appear in excellent position in the AP projection.
IMPRESSION: Satisfactory appearance of the right hip after total hip prosthesis
insertion.

## 2018-10-14 DIAGNOSIS — L821 Other seborrheic keratosis: Secondary | ICD-10-CM | POA: Diagnosis not present

## 2018-10-14 DIAGNOSIS — Z87898 Personal history of other specified conditions: Secondary | ICD-10-CM | POA: Diagnosis not present

## 2018-10-14 DIAGNOSIS — L851 Acquired keratosis [keratoderma] palmaris et plantaris: Secondary | ICD-10-CM | POA: Diagnosis not present

## 2018-10-14 DIAGNOSIS — L918 Other hypertrophic disorders of the skin: Secondary | ICD-10-CM | POA: Diagnosis not present

## 2018-10-14 DIAGNOSIS — L719 Rosacea, unspecified: Secondary | ICD-10-CM | POA: Diagnosis not present

## 2018-10-14 DIAGNOSIS — D225 Melanocytic nevi of trunk: Secondary | ICD-10-CM | POA: Diagnosis not present

## 2018-10-14 DIAGNOSIS — Z23 Encounter for immunization: Secondary | ICD-10-CM | POA: Diagnosis not present

## 2018-10-14 DIAGNOSIS — D485 Neoplasm of uncertain behavior of skin: Secondary | ICD-10-CM | POA: Diagnosis not present

## 2018-10-21 DIAGNOSIS — R197 Diarrhea, unspecified: Secondary | ICD-10-CM | POA: Diagnosis not present

## 2018-10-28 DIAGNOSIS — R197 Diarrhea, unspecified: Secondary | ICD-10-CM | POA: Diagnosis not present

## 2018-10-30 DIAGNOSIS — K529 Noninfective gastroenteritis and colitis, unspecified: Secondary | ICD-10-CM | POA: Diagnosis not present

## 2018-12-11 DIAGNOSIS — E559 Vitamin D deficiency, unspecified: Secondary | ICD-10-CM | POA: Diagnosis not present

## 2018-12-11 DIAGNOSIS — K529 Noninfective gastroenteritis and colitis, unspecified: Secondary | ICD-10-CM | POA: Diagnosis not present

## 2018-12-11 DIAGNOSIS — N182 Chronic kidney disease, stage 2 (mild): Secondary | ICD-10-CM | POA: Diagnosis not present

## 2018-12-11 DIAGNOSIS — R801 Persistent proteinuria, unspecified: Secondary | ICD-10-CM | POA: Diagnosis not present

## 2018-12-11 DIAGNOSIS — F325 Major depressive disorder, single episode, in full remission: Secondary | ICD-10-CM | POA: Diagnosis not present

## 2018-12-11 DIAGNOSIS — Z96642 Presence of left artificial hip joint: Secondary | ICD-10-CM | POA: Diagnosis not present

## 2018-12-11 DIAGNOSIS — E039 Hypothyroidism, unspecified: Secondary | ICD-10-CM | POA: Diagnosis not present

## 2018-12-11 DIAGNOSIS — E782 Mixed hyperlipidemia: Secondary | ICD-10-CM | POA: Diagnosis not present

## 2018-12-11 DIAGNOSIS — Z8739 Personal history of other diseases of the musculoskeletal system and connective tissue: Secondary | ICD-10-CM | POA: Diagnosis not present

## 2018-12-11 DIAGNOSIS — Z0000 Encounter for general adult medical examination without abnormal findings: Secondary | ICD-10-CM | POA: Diagnosis not present

## 2018-12-11 DIAGNOSIS — I10 Essential (primary) hypertension: Secondary | ICD-10-CM | POA: Diagnosis not present

## 2018-12-15 DIAGNOSIS — Z0000 Encounter for general adult medical examination without abnormal findings: Secondary | ICD-10-CM | POA: Diagnosis not present

## 2018-12-15 DIAGNOSIS — E782 Mixed hyperlipidemia: Secondary | ICD-10-CM | POA: Diagnosis not present

## 2018-12-15 DIAGNOSIS — E559 Vitamin D deficiency, unspecified: Secondary | ICD-10-CM | POA: Diagnosis not present

## 2018-12-15 DIAGNOSIS — R801 Persistent proteinuria, unspecified: Secondary | ICD-10-CM | POA: Diagnosis not present

## 2018-12-15 DIAGNOSIS — J309 Allergic rhinitis, unspecified: Secondary | ICD-10-CM | POA: Diagnosis not present

## 2018-12-15 DIAGNOSIS — Z79899 Other long term (current) drug therapy: Secondary | ICD-10-CM | POA: Diagnosis not present

## 2018-12-15 DIAGNOSIS — E039 Hypothyroidism, unspecified: Secondary | ICD-10-CM | POA: Diagnosis not present

## 2018-12-15 DIAGNOSIS — N182 Chronic kidney disease, stage 2 (mild): Secondary | ICD-10-CM | POA: Diagnosis not present

## 2018-12-15 DIAGNOSIS — R05 Cough: Secondary | ICD-10-CM | POA: Diagnosis not present

## 2018-12-15 DIAGNOSIS — F325 Major depressive disorder, single episode, in full remission: Secondary | ICD-10-CM | POA: Diagnosis not present

## 2018-12-15 DIAGNOSIS — I10 Essential (primary) hypertension: Secondary | ICD-10-CM | POA: Diagnosis not present

## 2018-12-15 DIAGNOSIS — R197 Diarrhea, unspecified: Secondary | ICD-10-CM | POA: Diagnosis not present

## 2018-12-23 DIAGNOSIS — Z1211 Encounter for screening for malignant neoplasm of colon: Secondary | ICD-10-CM | POA: Diagnosis not present

## 2019-03-30 DIAGNOSIS — H3122 Choroidal dystrophy (central areolar) (generalized) (peripapillary): Secondary | ICD-10-CM | POA: Diagnosis not present

## 2019-04-08 DIAGNOSIS — E039 Hypothyroidism, unspecified: Secondary | ICD-10-CM | POA: Diagnosis not present

## 2019-04-08 DIAGNOSIS — F325 Major depressive disorder, single episode, in full remission: Secondary | ICD-10-CM | POA: Diagnosis not present

## 2019-04-08 DIAGNOSIS — N83201 Unspecified ovarian cyst, right side: Secondary | ICD-10-CM | POA: Diagnosis not present

## 2019-04-08 DIAGNOSIS — E538 Deficiency of other specified B group vitamins: Secondary | ICD-10-CM | POA: Diagnosis not present

## 2019-04-08 DIAGNOSIS — Z96642 Presence of left artificial hip joint: Secondary | ICD-10-CM | POA: Diagnosis not present

## 2019-04-08 DIAGNOSIS — I10 Essential (primary) hypertension: Secondary | ICD-10-CM | POA: Diagnosis not present

## 2019-04-08 DIAGNOSIS — N182 Chronic kidney disease, stage 2 (mild): Secondary | ICD-10-CM | POA: Diagnosis not present

## 2019-04-08 DIAGNOSIS — K529 Noninfective gastroenteritis and colitis, unspecified: Secondary | ICD-10-CM | POA: Diagnosis not present

## 2019-04-08 DIAGNOSIS — R801 Persistent proteinuria, unspecified: Secondary | ICD-10-CM | POA: Diagnosis not present

## 2019-04-08 DIAGNOSIS — E559 Vitamin D deficiency, unspecified: Secondary | ICD-10-CM | POA: Diagnosis not present

## 2019-04-08 DIAGNOSIS — Z0000 Encounter for general adult medical examination without abnormal findings: Secondary | ICD-10-CM | POA: Diagnosis not present

## 2019-04-08 DIAGNOSIS — E782 Mixed hyperlipidemia: Secondary | ICD-10-CM | POA: Diagnosis not present

## 2019-04-15 DIAGNOSIS — E782 Mixed hyperlipidemia: Secondary | ICD-10-CM | POA: Diagnosis not present

## 2019-04-15 DIAGNOSIS — E039 Hypothyroidism, unspecified: Secondary | ICD-10-CM | POA: Diagnosis not present

## 2019-04-15 DIAGNOSIS — N182 Chronic kidney disease, stage 2 (mild): Secondary | ICD-10-CM | POA: Diagnosis not present

## 2019-04-15 DIAGNOSIS — F325 Major depressive disorder, single episode, in full remission: Secondary | ICD-10-CM | POA: Diagnosis not present

## 2019-04-15 DIAGNOSIS — I10 Essential (primary) hypertension: Secondary | ICD-10-CM | POA: Diagnosis not present

## 2019-05-19 DIAGNOSIS — Z23 Encounter for immunization: Secondary | ICD-10-CM | POA: Diagnosis not present

## 2019-06-09 DIAGNOSIS — Z1231 Encounter for screening mammogram for malignant neoplasm of breast: Secondary | ICD-10-CM | POA: Diagnosis not present

## 2019-06-09 DIAGNOSIS — I10 Essential (primary) hypertension: Secondary | ICD-10-CM | POA: Diagnosis not present

## 2019-06-09 DIAGNOSIS — Z96642 Presence of left artificial hip joint: Secondary | ICD-10-CM | POA: Diagnosis not present

## 2019-06-09 DIAGNOSIS — E538 Deficiency of other specified B group vitamins: Secondary | ICD-10-CM | POA: Diagnosis not present

## 2019-06-09 DIAGNOSIS — E039 Hypothyroidism, unspecified: Secondary | ICD-10-CM | POA: Diagnosis not present

## 2019-06-09 DIAGNOSIS — Z0000 Encounter for general adult medical examination without abnormal findings: Secondary | ICD-10-CM | POA: Diagnosis not present

## 2019-06-09 DIAGNOSIS — R801 Persistent proteinuria, unspecified: Secondary | ICD-10-CM | POA: Diagnosis not present

## 2019-06-09 DIAGNOSIS — Z8739 Personal history of other diseases of the musculoskeletal system and connective tissue: Secondary | ICD-10-CM | POA: Diagnosis not present

## 2019-06-09 DIAGNOSIS — Z79899 Other long term (current) drug therapy: Secondary | ICD-10-CM | POA: Diagnosis not present

## 2019-06-09 DIAGNOSIS — Z803 Family history of malignant neoplasm of breast: Secondary | ICD-10-CM | POA: Diagnosis not present

## 2019-06-09 DIAGNOSIS — N182 Chronic kidney disease, stage 2 (mild): Secondary | ICD-10-CM | POA: Diagnosis not present

## 2019-06-09 DIAGNOSIS — E559 Vitamin D deficiency, unspecified: Secondary | ICD-10-CM | POA: Diagnosis not present

## 2019-06-09 DIAGNOSIS — E782 Mixed hyperlipidemia: Secondary | ICD-10-CM | POA: Diagnosis not present

## 2019-06-09 DIAGNOSIS — F325 Major depressive disorder, single episode, in full remission: Secondary | ICD-10-CM | POA: Diagnosis not present

## 2019-06-12 DIAGNOSIS — N182 Chronic kidney disease, stage 2 (mild): Secondary | ICD-10-CM | POA: Diagnosis not present

## 2019-06-12 DIAGNOSIS — E538 Deficiency of other specified B group vitamins: Secondary | ICD-10-CM | POA: Diagnosis not present

## 2019-06-12 DIAGNOSIS — E039 Hypothyroidism, unspecified: Secondary | ICD-10-CM | POA: Diagnosis not present

## 2019-06-12 DIAGNOSIS — I10 Essential (primary) hypertension: Secondary | ICD-10-CM | POA: Diagnosis not present

## 2019-06-12 DIAGNOSIS — E782 Mixed hyperlipidemia: Secondary | ICD-10-CM | POA: Diagnosis not present

## 2019-06-12 DIAGNOSIS — Z8739 Personal history of other diseases of the musculoskeletal system and connective tissue: Secondary | ICD-10-CM | POA: Diagnosis not present

## 2019-06-12 DIAGNOSIS — R801 Persistent proteinuria, unspecified: Secondary | ICD-10-CM | POA: Diagnosis not present

## 2019-06-12 DIAGNOSIS — E559 Vitamin D deficiency, unspecified: Secondary | ICD-10-CM | POA: Diagnosis not present

## 2019-06-12 DIAGNOSIS — F325 Major depressive disorder, single episode, in full remission: Secondary | ICD-10-CM | POA: Diagnosis not present

## 2019-06-12 DIAGNOSIS — J309 Allergic rhinitis, unspecified: Secondary | ICD-10-CM | POA: Diagnosis not present

## 2019-07-03 DIAGNOSIS — N949 Unspecified condition associated with female genital organs and menstrual cycle: Secondary | ICD-10-CM | POA: Diagnosis not present

## 2019-09-03 DIAGNOSIS — I10 Essential (primary) hypertension: Secondary | ICD-10-CM | POA: Diagnosis not present

## 2019-09-03 DIAGNOSIS — N182 Chronic kidney disease, stage 2 (mild): Secondary | ICD-10-CM | POA: Diagnosis not present

## 2019-09-03 DIAGNOSIS — F325 Major depressive disorder, single episode, in full remission: Secondary | ICD-10-CM | POA: Diagnosis not present

## 2019-09-03 DIAGNOSIS — E782 Mixed hyperlipidemia: Secondary | ICD-10-CM | POA: Diagnosis not present

## 2019-09-03 DIAGNOSIS — E039 Hypothyroidism, unspecified: Secondary | ICD-10-CM | POA: Diagnosis not present

## 2019-09-15 DIAGNOSIS — F325 Major depressive disorder, single episode, in full remission: Secondary | ICD-10-CM | POA: Diagnosis not present

## 2019-09-15 DIAGNOSIS — E039 Hypothyroidism, unspecified: Secondary | ICD-10-CM | POA: Diagnosis not present

## 2019-09-15 DIAGNOSIS — I10 Essential (primary) hypertension: Secondary | ICD-10-CM | POA: Diagnosis not present

## 2019-09-15 DIAGNOSIS — E782 Mixed hyperlipidemia: Secondary | ICD-10-CM | POA: Diagnosis not present

## 2019-09-15 DIAGNOSIS — N182 Chronic kidney disease, stage 2 (mild): Secondary | ICD-10-CM | POA: Diagnosis not present

## 2019-10-09 ENCOUNTER — Ambulatory Visit: Payer: Medicare Other | Attending: Internal Medicine

## 2019-10-09 DIAGNOSIS — Z23 Encounter for immunization: Secondary | ICD-10-CM | POA: Insufficient documentation

## 2019-10-09 NOTE — Progress Notes (Signed)
   Covid-19 Vaccination Clinic  Name:  Darlene Sanders    MRN: [id number] DOB: 10/27/1945  10/09/2019  [name] was observed post Covid-19 immunization for 15 minutes without incidence. She was provided with Vaccine Information Sheet and instruction to access the V-Safe system.   [name] was instructed to call 911 with any severe reactions post vaccine: Marland Kitchen Difficulty breathing  . Swelling of your face and throat  . A fast heartbeat  . A bad rash all over your body  . Dizziness and weakness    Immunizations Administered    Name Date Dose VIS Date Route   Pfizer COVID-19 Vaccine 10/09/2019 11:33 AM 0.3 mL 07/24/2019 Intramuscular   Manufacturer: [place]   Lot: [id number]   [place]: [id number]

## 2019-11-03 ENCOUNTER — Ambulatory Visit: Payer: Medicare Other | Attending: Internal Medicine

## 2019-11-03 DIAGNOSIS — Z23 Encounter for immunization: Secondary | ICD-10-CM

## 2019-11-03 NOTE — Progress Notes (Signed)
   Covid-19 Vaccination Clinic  Name:  DORREEN OKE    MRN: [id number] DOB: 01/31/1946  11/03/2019  Ms. Kilian was observed post Covid-19 immunization for 15 minutes without incident. She was provided with Vaccine Information Sheet and instruction to access the V-Safe system.   Ms. Toller was instructed to call 911 with any severe reactions post vaccine: Marland Kitchen Difficulty breathing  . Swelling of face and throat  . A fast heartbeat  . A bad rash all over body  . Dizziness and weakness   Immunizations Administered    Name Date Dose VIS Date Route   Pfizer COVID-19 Vaccine 11/03/2019  2:14 PM 0.3 mL 07/24/2019 Intramuscular   Manufacturer: [place]   Lot: [id number]   [place]: [id number]

## 2019-11-12 DIAGNOSIS — D225 Melanocytic nevi of trunk: Secondary | ICD-10-CM | POA: Diagnosis not present

## 2019-11-12 DIAGNOSIS — L719 Rosacea, unspecified: Secondary | ICD-10-CM | POA: Diagnosis not present

## 2019-11-12 DIAGNOSIS — L851 Acquired keratosis [keratoderma] palmaris et plantaris: Secondary | ICD-10-CM | POA: Diagnosis not present

## 2019-11-12 DIAGNOSIS — L821 Other seborrheic keratosis: Secondary | ICD-10-CM | POA: Diagnosis not present

## 2019-11-12 DIAGNOSIS — Z87898 Personal history of other specified conditions: Secondary | ICD-10-CM | POA: Diagnosis not present

## 2019-11-13 DIAGNOSIS — F325 Major depressive disorder, single episode, in full remission: Secondary | ICD-10-CM | POA: Diagnosis not present

## 2019-11-13 DIAGNOSIS — I10 Essential (primary) hypertension: Secondary | ICD-10-CM | POA: Diagnosis not present

## 2019-11-13 DIAGNOSIS — E782 Mixed hyperlipidemia: Secondary | ICD-10-CM | POA: Diagnosis not present

## 2019-11-13 DIAGNOSIS — E039 Hypothyroidism, unspecified: Secondary | ICD-10-CM | POA: Diagnosis not present

## 2019-11-13 DIAGNOSIS — N182 Chronic kidney disease, stage 2 (mild): Secondary | ICD-10-CM | POA: Diagnosis not present

## 2019-11-18 DIAGNOSIS — Z79899 Other long term (current) drug therapy: Secondary | ICD-10-CM | POA: Diagnosis not present

## 2019-11-18 DIAGNOSIS — N182 Chronic kidney disease, stage 2 (mild): Secondary | ICD-10-CM | POA: Diagnosis not present

## 2019-11-18 DIAGNOSIS — Z0000 Encounter for general adult medical examination without abnormal findings: Secondary | ICD-10-CM | POA: Diagnosis not present

## 2019-11-18 DIAGNOSIS — E782 Mixed hyperlipidemia: Secondary | ICD-10-CM | POA: Diagnosis not present

## 2019-11-18 DIAGNOSIS — F325 Major depressive disorder, single episode, in full remission: Secondary | ICD-10-CM | POA: Diagnosis not present

## 2019-11-18 DIAGNOSIS — E559 Vitamin D deficiency, unspecified: Secondary | ICD-10-CM | POA: Diagnosis not present

## 2019-11-18 DIAGNOSIS — I10 Essential (primary) hypertension: Secondary | ICD-10-CM | POA: Diagnosis not present

## 2019-11-18 DIAGNOSIS — J309 Allergic rhinitis, unspecified: Secondary | ICD-10-CM | POA: Diagnosis not present

## 2019-11-18 DIAGNOSIS — K529 Noninfective gastroenteritis and colitis, unspecified: Secondary | ICD-10-CM | POA: Diagnosis not present

## 2019-11-18 DIAGNOSIS — R801 Persistent proteinuria, unspecified: Secondary | ICD-10-CM | POA: Diagnosis not present

## 2019-11-18 DIAGNOSIS — E538 Deficiency of other specified B group vitamins: Secondary | ICD-10-CM | POA: Diagnosis not present

## 2019-11-18 DIAGNOSIS — E039 Hypothyroidism, unspecified: Secondary | ICD-10-CM | POA: Diagnosis not present

## 2019-11-23 DIAGNOSIS — F325 Major depressive disorder, single episode, in full remission: Secondary | ICD-10-CM | POA: Diagnosis not present

## 2019-11-23 DIAGNOSIS — E039 Hypothyroidism, unspecified: Secondary | ICD-10-CM | POA: Diagnosis not present

## 2019-11-23 DIAGNOSIS — E538 Deficiency of other specified B group vitamins: Secondary | ICD-10-CM | POA: Diagnosis not present

## 2019-11-23 DIAGNOSIS — R801 Persistent proteinuria, unspecified: Secondary | ICD-10-CM | POA: Diagnosis not present

## 2019-11-23 DIAGNOSIS — N182 Chronic kidney disease, stage 2 (mild): Secondary | ICD-10-CM | POA: Diagnosis not present

## 2019-11-23 DIAGNOSIS — Z0000 Encounter for general adult medical examination without abnormal findings: Secondary | ICD-10-CM | POA: Diagnosis not present

## 2019-11-23 DIAGNOSIS — E559 Vitamin D deficiency, unspecified: Secondary | ICD-10-CM | POA: Diagnosis not present

## 2019-11-23 DIAGNOSIS — J309 Allergic rhinitis, unspecified: Secondary | ICD-10-CM | POA: Diagnosis not present

## 2019-11-23 DIAGNOSIS — E782 Mixed hyperlipidemia: Secondary | ICD-10-CM | POA: Diagnosis not present

## 2019-11-23 DIAGNOSIS — Z1211 Encounter for screening for malignant neoplasm of colon: Secondary | ICD-10-CM | POA: Diagnosis not present

## 2019-11-23 DIAGNOSIS — I10 Essential (primary) hypertension: Secondary | ICD-10-CM | POA: Diagnosis not present

## 2019-12-16 DIAGNOSIS — F325 Major depressive disorder, single episode, in full remission: Secondary | ICD-10-CM | POA: Diagnosis not present

## 2019-12-16 DIAGNOSIS — E782 Mixed hyperlipidemia: Secondary | ICD-10-CM | POA: Diagnosis not present

## 2019-12-16 DIAGNOSIS — I10 Essential (primary) hypertension: Secondary | ICD-10-CM | POA: Diagnosis not present

## 2019-12-16 DIAGNOSIS — N182 Chronic kidney disease, stage 2 (mild): Secondary | ICD-10-CM | POA: Diagnosis not present

## 2019-12-16 DIAGNOSIS — E039 Hypothyroidism, unspecified: Secondary | ICD-10-CM | POA: Diagnosis not present

## 2020-03-09 DIAGNOSIS — E039 Hypothyroidism, unspecified: Secondary | ICD-10-CM | POA: Diagnosis not present

## 2020-03-09 DIAGNOSIS — E782 Mixed hyperlipidemia: Secondary | ICD-10-CM | POA: Diagnosis not present

## 2020-03-09 DIAGNOSIS — I10 Essential (primary) hypertension: Secondary | ICD-10-CM | POA: Diagnosis not present

## 2020-03-09 DIAGNOSIS — F325 Major depressive disorder, single episode, in full remission: Secondary | ICD-10-CM | POA: Diagnosis not present

## 2020-03-09 DIAGNOSIS — N182 Chronic kidney disease, stage 2 (mild): Secondary | ICD-10-CM | POA: Diagnosis not present

## 2020-04-08 DIAGNOSIS — B399 Histoplasmosis, unspecified: Secondary | ICD-10-CM | POA: Diagnosis not present

## 2020-04-08 DIAGNOSIS — H40013 Open angle with borderline findings, low risk, bilateral: Secondary | ICD-10-CM | POA: Diagnosis not present

## 2020-05-17 DIAGNOSIS — Z23 Encounter for immunization: Secondary | ICD-10-CM | POA: Diagnosis not present

## 2020-05-31 DIAGNOSIS — Z122 Encounter for screening for malignant neoplasm of respiratory organs: Secondary | ICD-10-CM | POA: Diagnosis not present

## 2020-05-31 DIAGNOSIS — E782 Mixed hyperlipidemia: Secondary | ICD-10-CM | POA: Diagnosis not present

## 2020-05-31 DIAGNOSIS — R801 Persistent proteinuria, unspecified: Secondary | ICD-10-CM | POA: Diagnosis not present

## 2020-05-31 DIAGNOSIS — F325 Major depressive disorder, single episode, in full remission: Secondary | ICD-10-CM | POA: Diagnosis not present

## 2020-05-31 DIAGNOSIS — E538 Deficiency of other specified B group vitamins: Secondary | ICD-10-CM | POA: Diagnosis not present

## 2020-05-31 DIAGNOSIS — E559 Vitamin D deficiency, unspecified: Secondary | ICD-10-CM | POA: Diagnosis not present

## 2020-05-31 DIAGNOSIS — I10 Essential (primary) hypertension: Secondary | ICD-10-CM | POA: Diagnosis not present

## 2020-05-31 DIAGNOSIS — J309 Allergic rhinitis, unspecified: Secondary | ICD-10-CM | POA: Diagnosis not present

## 2020-05-31 DIAGNOSIS — E039 Hypothyroidism, unspecified: Secondary | ICD-10-CM | POA: Diagnosis not present

## 2020-05-31 DIAGNOSIS — F17210 Nicotine dependence, cigarettes, uncomplicated: Secondary | ICD-10-CM | POA: Diagnosis not present

## 2020-06-01 ENCOUNTER — Other Ambulatory Visit: Payer: Self-pay | Admitting: Family Medicine

## 2020-06-01 DIAGNOSIS — F17210 Nicotine dependence, cigarettes, uncomplicated: Secondary | ICD-10-CM

## 2020-06-01 DIAGNOSIS — Z122 Encounter for screening for malignant neoplasm of respiratory organs: Secondary | ICD-10-CM

## 2020-06-02 DIAGNOSIS — D751 Secondary polycythemia: Secondary | ICD-10-CM | POA: Diagnosis not present

## 2020-06-02 DIAGNOSIS — E039 Hypothyroidism, unspecified: Secondary | ICD-10-CM | POA: Diagnosis not present

## 2020-06-02 DIAGNOSIS — J309 Allergic rhinitis, unspecified: Secondary | ICD-10-CM | POA: Diagnosis not present

## 2020-06-02 DIAGNOSIS — I10 Essential (primary) hypertension: Secondary | ICD-10-CM | POA: Diagnosis not present

## 2020-06-02 DIAGNOSIS — R801 Persistent proteinuria, unspecified: Secondary | ICD-10-CM | POA: Diagnosis not present

## 2020-06-02 DIAGNOSIS — E559 Vitamin D deficiency, unspecified: Secondary | ICD-10-CM | POA: Diagnosis not present

## 2020-06-02 DIAGNOSIS — F17210 Nicotine dependence, cigarettes, uncomplicated: Secondary | ICD-10-CM | POA: Diagnosis not present

## 2020-06-02 DIAGNOSIS — E538 Deficiency of other specified B group vitamins: Secondary | ICD-10-CM | POA: Diagnosis not present

## 2020-06-02 DIAGNOSIS — E782 Mixed hyperlipidemia: Secondary | ICD-10-CM | POA: Diagnosis not present

## 2020-06-02 DIAGNOSIS — F325 Major depressive disorder, single episode, in full remission: Secondary | ICD-10-CM | POA: Diagnosis not present

## 2020-07-15 DIAGNOSIS — Z23 Encounter for immunization: Secondary | ICD-10-CM | POA: Diagnosis not present

## 2020-11-03 ENCOUNTER — Ambulatory Visit
Admission: RE | Admit: 2020-11-03 | Discharge: 2020-11-03 | Disposition: A | Discharge status: Home / Self Care | Payer: Medicare Other | Source: Ambulatory Visit | Attending: Family Medicine | Admitting: Family Medicine

## 2020-11-03 DIAGNOSIS — Z122 Encounter for screening for malignant neoplasm of respiratory organs: Secondary | ICD-10-CM

## 2020-11-03 DIAGNOSIS — F17210 Nicotine dependence, cigarettes, uncomplicated: Secondary | ICD-10-CM

## 2020-11-15 DIAGNOSIS — L719 Rosacea, unspecified: Secondary | ICD-10-CM | POA: Diagnosis not present

## 2020-11-15 DIAGNOSIS — L851 Acquired keratosis [keratoderma] palmaris et plantaris: Secondary | ICD-10-CM | POA: Diagnosis not present

## 2020-11-15 DIAGNOSIS — Z87898 Personal history of other specified conditions: Secondary | ICD-10-CM | POA: Diagnosis not present

## 2020-11-15 DIAGNOSIS — D225 Melanocytic nevi of trunk: Secondary | ICD-10-CM | POA: Diagnosis not present

## 2020-11-15 DIAGNOSIS — L570 Actinic keratosis: Secondary | ICD-10-CM | POA: Diagnosis not present

## 2020-11-15 DIAGNOSIS — L821 Other seborrheic keratosis: Secondary | ICD-10-CM | POA: Diagnosis not present

## 2020-11-18 DIAGNOSIS — J309 Allergic rhinitis, unspecified: Secondary | ICD-10-CM | POA: Diagnosis not present

## 2020-11-18 DIAGNOSIS — F325 Major depressive disorder, single episode, in full remission: Secondary | ICD-10-CM | POA: Diagnosis not present

## 2020-11-18 DIAGNOSIS — R801 Persistent proteinuria, unspecified: Secondary | ICD-10-CM | POA: Diagnosis not present

## 2020-11-18 DIAGNOSIS — E538 Deficiency of other specified B group vitamins: Secondary | ICD-10-CM | POA: Diagnosis not present

## 2020-11-18 DIAGNOSIS — Z122 Encounter for screening for malignant neoplasm of respiratory organs: Secondary | ICD-10-CM | POA: Diagnosis not present

## 2020-11-18 DIAGNOSIS — Z0000 Encounter for general adult medical examination without abnormal findings: Secondary | ICD-10-CM | POA: Diagnosis not present

## 2020-11-18 DIAGNOSIS — E782 Mixed hyperlipidemia: Secondary | ICD-10-CM | POA: Diagnosis not present

## 2020-11-18 DIAGNOSIS — E039 Hypothyroidism, unspecified: Secondary | ICD-10-CM | POA: Diagnosis not present

## 2020-11-18 DIAGNOSIS — D751 Secondary polycythemia: Secondary | ICD-10-CM | POA: Diagnosis not present

## 2020-11-18 DIAGNOSIS — E559 Vitamin D deficiency, unspecified: Secondary | ICD-10-CM | POA: Diagnosis not present

## 2020-11-18 DIAGNOSIS — I10 Essential (primary) hypertension: Secondary | ICD-10-CM | POA: Diagnosis not present

## 2020-11-24 DIAGNOSIS — Z0000 Encounter for general adult medical examination without abnormal findings: Secondary | ICD-10-CM | POA: Diagnosis not present

## 2020-11-24 DIAGNOSIS — I10 Essential (primary) hypertension: Secondary | ICD-10-CM | POA: Diagnosis not present

## 2020-11-24 DIAGNOSIS — N393 Stress incontinence (female) (male): Secondary | ICD-10-CM | POA: Diagnosis not present

## 2020-11-24 DIAGNOSIS — E559 Vitamin D deficiency, unspecified: Secondary | ICD-10-CM | POA: Diagnosis not present

## 2020-11-24 DIAGNOSIS — F325 Major depressive disorder, single episode, in full remission: Secondary | ICD-10-CM | POA: Diagnosis not present

## 2020-11-24 DIAGNOSIS — F17210 Nicotine dependence, cigarettes, uncomplicated: Secondary | ICD-10-CM | POA: Diagnosis not present

## 2020-11-24 DIAGNOSIS — E538 Deficiency of other specified B group vitamins: Secondary | ICD-10-CM | POA: Diagnosis not present

## 2020-11-24 DIAGNOSIS — J309 Allergic rhinitis, unspecified: Secondary | ICD-10-CM | POA: Diagnosis not present

## 2020-11-24 DIAGNOSIS — E039 Hypothyroidism, unspecified: Secondary | ICD-10-CM | POA: Diagnosis not present

## 2020-11-24 DIAGNOSIS — E782 Mixed hyperlipidemia: Secondary | ICD-10-CM | POA: Diagnosis not present

## 2020-12-07 DIAGNOSIS — N9489 Other specified conditions associated with female genital organs and menstrual cycle: Secondary | ICD-10-CM | POA: Diagnosis not present

## 2020-12-07 DIAGNOSIS — Z01419 Encounter for gynecological examination (general) (routine) without abnormal findings: Secondary | ICD-10-CM | POA: Diagnosis not present

## 2021-03-10 DIAGNOSIS — Z23 Encounter for immunization: Secondary | ICD-10-CM | POA: Diagnosis not present

## 2021-05-15 DIAGNOSIS — B399 Histoplasmosis, unspecified: Secondary | ICD-10-CM | POA: Diagnosis not present

## 2021-05-15 DIAGNOSIS — H40013 Open angle with borderline findings, low risk, bilateral: Secondary | ICD-10-CM | POA: Diagnosis not present

## 2021-05-24 DIAGNOSIS — E039 Hypothyroidism, unspecified: Secondary | ICD-10-CM | POA: Diagnosis not present

## 2021-05-24 DIAGNOSIS — I10 Essential (primary) hypertension: Secondary | ICD-10-CM | POA: Diagnosis not present

## 2021-05-30 DIAGNOSIS — E782 Mixed hyperlipidemia: Secondary | ICD-10-CM | POA: Diagnosis not present

## 2021-05-30 DIAGNOSIS — J309 Allergic rhinitis, unspecified: Secondary | ICD-10-CM | POA: Diagnosis not present

## 2021-05-30 DIAGNOSIS — E039 Hypothyroidism, unspecified: Secondary | ICD-10-CM | POA: Diagnosis not present

## 2021-05-30 DIAGNOSIS — R801 Persistent proteinuria, unspecified: Secondary | ICD-10-CM | POA: Diagnosis not present

## 2021-05-30 DIAGNOSIS — N9489 Other specified conditions associated with female genital organs and menstrual cycle: Secondary | ICD-10-CM | POA: Diagnosis not present

## 2021-05-30 DIAGNOSIS — I10 Essential (primary) hypertension: Secondary | ICD-10-CM | POA: Diagnosis not present

## 2021-05-30 DIAGNOSIS — F17210 Nicotine dependence, cigarettes, uncomplicated: Secondary | ICD-10-CM | POA: Diagnosis not present

## 2021-05-30 DIAGNOSIS — F325 Major depressive disorder, single episode, in full remission: Secondary | ICD-10-CM | POA: Diagnosis not present

## 2021-05-30 DIAGNOSIS — Z23 Encounter for immunization: Secondary | ICD-10-CM | POA: Diagnosis not present

## 2021-05-30 DIAGNOSIS — E538 Deficiency of other specified B group vitamins: Secondary | ICD-10-CM | POA: Diagnosis not present

## 2021-05-30 DIAGNOSIS — E559 Vitamin D deficiency, unspecified: Secondary | ICD-10-CM | POA: Diagnosis not present

## 2021-05-30 DIAGNOSIS — N182 Chronic kidney disease, stage 2 (mild): Secondary | ICD-10-CM | POA: Diagnosis not present

## 2021-06-20 DIAGNOSIS — Z1231 Encounter for screening mammogram for malignant neoplasm of breast: Secondary | ICD-10-CM | POA: Diagnosis not present

## 2021-07-05 DIAGNOSIS — R928 Other abnormal and inconclusive findings on diagnostic imaging of breast: Secondary | ICD-10-CM | POA: Diagnosis not present

## 2021-07-05 DIAGNOSIS — R922 Inconclusive mammogram: Secondary | ICD-10-CM | POA: Diagnosis not present

## 2021-07-05 DIAGNOSIS — R921 Mammographic calcification found on diagnostic imaging of breast: Secondary | ICD-10-CM | POA: Diagnosis not present

## 2021-07-12 DIAGNOSIS — Z23 Encounter for immunization: Secondary | ICD-10-CM | POA: Diagnosis not present

## 2021-07-25 DIAGNOSIS — N6310 Unspecified lump in the right breast, unspecified quadrant: Secondary | ICD-10-CM | POA: Diagnosis not present

## 2021-07-25 DIAGNOSIS — R921 Mammographic calcification found on diagnostic imaging of breast: Secondary | ICD-10-CM | POA: Diagnosis not present

## 2021-08-04 DIAGNOSIS — U071 COVID-19: Secondary | ICD-10-CM | POA: Diagnosis not present

## 2021-11-16 DIAGNOSIS — D225 Melanocytic nevi of trunk: Secondary | ICD-10-CM | POA: Diagnosis not present

## 2021-11-16 DIAGNOSIS — Z87898 Personal history of other specified conditions: Secondary | ICD-10-CM | POA: Diagnosis not present

## 2021-11-16 DIAGNOSIS — L578 Other skin changes due to chronic exposure to nonionizing radiation: Secondary | ICD-10-CM | POA: Diagnosis not present

## 2021-11-16 DIAGNOSIS — L821 Other seborrheic keratosis: Secondary | ICD-10-CM | POA: Diagnosis not present

## 2021-11-16 DIAGNOSIS — L814 Other melanin hyperpigmentation: Secondary | ICD-10-CM | POA: Diagnosis not present

## 2021-11-16 DIAGNOSIS — D485 Neoplasm of uncertain behavior of skin: Secondary | ICD-10-CM | POA: Diagnosis not present

## 2021-11-27 DIAGNOSIS — R801 Persistent proteinuria, unspecified: Secondary | ICD-10-CM | POA: Diagnosis not present

## 2021-11-27 DIAGNOSIS — E782 Mixed hyperlipidemia: Secondary | ICD-10-CM | POA: Diagnosis not present

## 2021-11-27 DIAGNOSIS — E039 Hypothyroidism, unspecified: Secondary | ICD-10-CM | POA: Diagnosis not present

## 2021-11-30 DIAGNOSIS — F325 Major depressive disorder, single episode, in full remission: Secondary | ICD-10-CM | POA: Diagnosis not present

## 2021-11-30 DIAGNOSIS — I10 Essential (primary) hypertension: Secondary | ICD-10-CM | POA: Diagnosis not present

## 2021-11-30 DIAGNOSIS — Z1389 Encounter for screening for other disorder: Secondary | ICD-10-CM | POA: Diagnosis not present

## 2021-11-30 DIAGNOSIS — E559 Vitamin D deficiency, unspecified: Secondary | ICD-10-CM | POA: Diagnosis not present

## 2021-11-30 DIAGNOSIS — E039 Hypothyroidism, unspecified: Secondary | ICD-10-CM | POA: Diagnosis not present

## 2021-11-30 DIAGNOSIS — J309 Allergic rhinitis, unspecified: Secondary | ICD-10-CM | POA: Diagnosis not present

## 2021-11-30 DIAGNOSIS — Z0000 Encounter for general adult medical examination without abnormal findings: Secondary | ICD-10-CM | POA: Diagnosis not present

## 2021-11-30 DIAGNOSIS — E782 Mixed hyperlipidemia: Secondary | ICD-10-CM | POA: Diagnosis not present

## 2021-11-30 DIAGNOSIS — E538 Deficiency of other specified B group vitamins: Secondary | ICD-10-CM | POA: Diagnosis not present

## 2021-11-30 DIAGNOSIS — R801 Persistent proteinuria, unspecified: Secondary | ICD-10-CM | POA: Diagnosis not present

## 2021-11-30 DIAGNOSIS — Z1211 Encounter for screening for malignant neoplasm of colon: Secondary | ICD-10-CM | POA: Diagnosis not present

## 2022-05-16 DIAGNOSIS — B399 Histoplasmosis, unspecified: Secondary | ICD-10-CM | POA: Diagnosis not present

## 2022-05-21 DIAGNOSIS — R801 Persistent proteinuria, unspecified: Secondary | ICD-10-CM | POA: Diagnosis not present

## 2022-05-21 DIAGNOSIS — E039 Hypothyroidism, unspecified: Secondary | ICD-10-CM | POA: Diagnosis not present

## 2022-05-24 DIAGNOSIS — Z23 Encounter for immunization: Secondary | ICD-10-CM | POA: Diagnosis not present

## 2022-05-24 DIAGNOSIS — F325 Major depressive disorder, single episode, in full remission: Secondary | ICD-10-CM | POA: Diagnosis not present

## 2022-05-24 DIAGNOSIS — E782 Mixed hyperlipidemia: Secondary | ICD-10-CM | POA: Diagnosis not present

## 2022-05-24 DIAGNOSIS — N182 Chronic kidney disease, stage 2 (mild): Secondary | ICD-10-CM | POA: Diagnosis not present

## 2022-05-24 DIAGNOSIS — E538 Deficiency of other specified B group vitamins: Secondary | ICD-10-CM | POA: Diagnosis not present

## 2022-05-24 DIAGNOSIS — Z6822 Body mass index (BMI) 22.0-22.9, adult: Secondary | ICD-10-CM | POA: Diagnosis not present

## 2022-05-24 DIAGNOSIS — E039 Hypothyroidism, unspecified: Secondary | ICD-10-CM | POA: Diagnosis not present

## 2022-05-24 DIAGNOSIS — I10 Essential (primary) hypertension: Secondary | ICD-10-CM | POA: Diagnosis not present

## 2022-05-24 DIAGNOSIS — R801 Persistent proteinuria, unspecified: Secondary | ICD-10-CM | POA: Diagnosis not present

## 2022-06-26 DIAGNOSIS — Z1231 Encounter for screening mammogram for malignant neoplasm of breast: Secondary | ICD-10-CM | POA: Diagnosis not present

## 2022-06-26 DIAGNOSIS — Z79899 Other long term (current) drug therapy: Secondary | ICD-10-CM | POA: Diagnosis not present

## 2022-06-26 DIAGNOSIS — R801 Persistent proteinuria, unspecified: Secondary | ICD-10-CM | POA: Diagnosis not present

## 2022-08-29 DIAGNOSIS — Z23 Encounter for immunization: Secondary | ICD-10-CM | POA: Diagnosis not present

## 2022-11-19 DIAGNOSIS — Z87898 Personal history of other specified conditions: Secondary | ICD-10-CM | POA: Diagnosis not present

## 2022-11-19 DIAGNOSIS — D485 Neoplasm of uncertain behavior of skin: Secondary | ICD-10-CM | POA: Diagnosis not present

## 2022-11-19 DIAGNOSIS — C44311 Basal cell carcinoma of skin of nose: Secondary | ICD-10-CM | POA: Diagnosis not present

## 2022-11-19 DIAGNOSIS — L814 Other melanin hyperpigmentation: Secondary | ICD-10-CM | POA: Diagnosis not present

## 2022-11-19 DIAGNOSIS — D225 Melanocytic nevi of trunk: Secondary | ICD-10-CM | POA: Diagnosis not present

## 2022-11-19 DIAGNOSIS — L578 Other skin changes due to chronic exposure to nonionizing radiation: Secondary | ICD-10-CM | POA: Diagnosis not present

## 2022-11-19 DIAGNOSIS — L821 Other seborrheic keratosis: Secondary | ICD-10-CM | POA: Diagnosis not present

## 2022-12-05 DIAGNOSIS — E782 Mixed hyperlipidemia: Secondary | ICD-10-CM | POA: Diagnosis not present

## 2022-12-05 DIAGNOSIS — E039 Hypothyroidism, unspecified: Secondary | ICD-10-CM | POA: Diagnosis not present

## 2022-12-10 DIAGNOSIS — Z01419 Encounter for gynecological examination (general) (routine) without abnormal findings: Secondary | ICD-10-CM | POA: Diagnosis not present

## 2022-12-10 DIAGNOSIS — N9489 Other specified conditions associated with female genital organs and menstrual cycle: Secondary | ICD-10-CM | POA: Diagnosis not present

## 2022-12-12 DIAGNOSIS — F325 Major depressive disorder, single episode, in full remission: Secondary | ICD-10-CM | POA: Diagnosis not present

## 2022-12-12 DIAGNOSIS — I10 Essential (primary) hypertension: Secondary | ICD-10-CM | POA: Diagnosis not present

## 2022-12-12 DIAGNOSIS — Z0000 Encounter for general adult medical examination without abnormal findings: Secondary | ICD-10-CM | POA: Diagnosis not present

## 2022-12-12 DIAGNOSIS — Z1211 Encounter for screening for malignant neoplasm of colon: Secondary | ICD-10-CM | POA: Diagnosis not present

## 2022-12-12 DIAGNOSIS — F17200 Nicotine dependence, unspecified, uncomplicated: Secondary | ICD-10-CM | POA: Diagnosis not present

## 2022-12-12 DIAGNOSIS — F17210 Nicotine dependence, cigarettes, uncomplicated: Secondary | ICD-10-CM | POA: Diagnosis not present

## 2022-12-12 DIAGNOSIS — E538 Deficiency of other specified B group vitamins: Secondary | ICD-10-CM | POA: Diagnosis not present

## 2022-12-12 DIAGNOSIS — E039 Hypothyroidism, unspecified: Secondary | ICD-10-CM | POA: Diagnosis not present

## 2022-12-12 DIAGNOSIS — N393 Stress incontinence (female) (male): Secondary | ICD-10-CM | POA: Diagnosis not present

## 2022-12-12 DIAGNOSIS — Z23 Encounter for immunization: Secondary | ICD-10-CM | POA: Diagnosis not present

## 2022-12-12 DIAGNOSIS — E782 Mixed hyperlipidemia: Secondary | ICD-10-CM | POA: Diagnosis not present

## 2022-12-12 DIAGNOSIS — E559 Vitamin D deficiency, unspecified: Secondary | ICD-10-CM | POA: Diagnosis not present

## 2022-12-26 DIAGNOSIS — C44311 Basal cell carcinoma of skin of nose: Secondary | ICD-10-CM | POA: Diagnosis not present

## 2023-03-13 DIAGNOSIS — Z131 Encounter for screening for diabetes mellitus: Secondary | ICD-10-CM | POA: Diagnosis not present

## 2023-03-13 DIAGNOSIS — Z6822 Body mass index (BMI) 22.0-22.9, adult: Secondary | ICD-10-CM | POA: Diagnosis not present

## 2023-03-13 DIAGNOSIS — G629 Polyneuropathy, unspecified: Secondary | ICD-10-CM | POA: Diagnosis not present

## 2023-03-27 DIAGNOSIS — E782 Mixed hyperlipidemia: Secondary | ICD-10-CM | POA: Diagnosis not present

## 2023-03-27 DIAGNOSIS — Z713 Dietary counseling and surveillance: Secondary | ICD-10-CM | POA: Diagnosis not present

## 2023-03-27 DIAGNOSIS — F17210 Nicotine dependence, cigarettes, uncomplicated: Secondary | ICD-10-CM | POA: Diagnosis not present

## 2023-03-27 DIAGNOSIS — N182 Chronic kidney disease, stage 2 (mild): Secondary | ICD-10-CM | POA: Diagnosis not present

## 2023-03-27 DIAGNOSIS — E559 Vitamin D deficiency, unspecified: Secondary | ICD-10-CM | POA: Diagnosis not present

## 2023-03-27 DIAGNOSIS — E538 Deficiency of other specified B group vitamins: Secondary | ICD-10-CM | POA: Diagnosis not present

## 2023-03-27 DIAGNOSIS — E039 Hypothyroidism, unspecified: Secondary | ICD-10-CM | POA: Diagnosis not present

## 2023-03-27 DIAGNOSIS — I10 Essential (primary) hypertension: Secondary | ICD-10-CM | POA: Diagnosis not present

## 2023-05-17 DIAGNOSIS — H40053 Ocular hypertension, bilateral: Secondary | ICD-10-CM | POA: Diagnosis not present

## 2023-05-27 DIAGNOSIS — Z6823 Body mass index (BMI) 23.0-23.9, adult: Secondary | ICD-10-CM | POA: Diagnosis not present

## 2023-05-27 DIAGNOSIS — I10 Essential (primary) hypertension: Secondary | ICD-10-CM | POA: Diagnosis not present

## 2023-05-27 DIAGNOSIS — E559 Vitamin D deficiency, unspecified: Secondary | ICD-10-CM | POA: Diagnosis not present

## 2023-05-27 DIAGNOSIS — E039 Hypothyroidism, unspecified: Secondary | ICD-10-CM | POA: Diagnosis not present

## 2023-05-27 DIAGNOSIS — F17210 Nicotine dependence, cigarettes, uncomplicated: Secondary | ICD-10-CM | POA: Diagnosis not present

## 2023-05-27 DIAGNOSIS — E782 Mixed hyperlipidemia: Secondary | ICD-10-CM | POA: Diagnosis not present

## 2023-05-27 DIAGNOSIS — R7309 Other abnormal glucose: Secondary | ICD-10-CM | POA: Diagnosis not present

## 2023-05-27 DIAGNOSIS — Z23 Encounter for immunization: Secondary | ICD-10-CM | POA: Diagnosis not present

## 2023-05-27 DIAGNOSIS — G629 Polyneuropathy, unspecified: Secondary | ICD-10-CM | POA: Diagnosis not present

## 2023-05-27 DIAGNOSIS — R053 Chronic cough: Secondary | ICD-10-CM | POA: Diagnosis not present

## 2023-05-27 DIAGNOSIS — E538 Deficiency of other specified B group vitamins: Secondary | ICD-10-CM | POA: Diagnosis not present

## 2023-05-27 DIAGNOSIS — F3341 Major depressive disorder, recurrent, in partial remission: Secondary | ICD-10-CM | POA: Diagnosis not present

## 2023-06-12 ENCOUNTER — Other Ambulatory Visit: Payer: Self-pay | Admitting: Emergency Medicine

## 2023-06-12 DIAGNOSIS — F17210 Nicotine dependence, cigarettes, uncomplicated: Secondary | ICD-10-CM

## 2023-06-12 DIAGNOSIS — Z87891 Personal history of nicotine dependence: Secondary | ICD-10-CM

## 2023-06-12 DIAGNOSIS — Z122 Encounter for screening for malignant neoplasm of respiratory organs: Secondary | ICD-10-CM

## 2023-06-19 ENCOUNTER — Ambulatory Visit: Payer: Medicare Other | Admitting: Acute Care

## 2023-06-19 ENCOUNTER — Encounter: Payer: Self-pay | Admitting: Acute Care

## 2023-06-19 DIAGNOSIS — F17210 Nicotine dependence, cigarettes, uncomplicated: Secondary | ICD-10-CM | POA: Diagnosis not present

## 2023-06-19 NOTE — Patient Instructions (Signed)
 Thank you for participating in the  Lung Cancer Screening Program. It was our pleasure to meet you today. We will call you with the results of your scan within the next few days. Your scan will be assigned a Lung RADS category score by the physicians reading the scans.  This Lung RADS score determines follow up scanning.  See below for description of categories, and follow up screening recommendations. We will be in touch to schedule your follow up screening annually or based on recommendations of our providers. We will fax a copy of your scan results to your Primary Care Physician, or the physician who referred you to the program, to ensure they have the results. Please call the office if you have any questions or concerns regarding your scanning experience or results.  Our office number is [phone]. Please speak with Abigail Miyamoto, RN., Karlton Lemon RN, or Pietro Cassis RN. They are  our Lung Cancer Screening RN.'s If They are unavailable when you call, Please leave a message on the voice mail. We will return your call at our earliest convenience.This voice mail is monitored several times a day.  Remember, if your scan is normal, we will scan you annually as long as you continue to meet the criteria for the program. (Age 77-80, Current smoker or smoker who has quit within the last 15 years). If you are a smoker, remember, quitting is the single most powerful action that you can take to decrease your risk of lung cancer and other pulmonary, breathing related problems. We know quitting is hard, and we are here to help.  Please let us know if there is anything we can do to help you meet your goal of quitting. If you are a former smoker, Counselling psychologist. We are proud of you! Remain smoke free! Remember you can refer friends or family members through the number above.  We will screen them to make sure they meet criteria for the program. Thank you for helping [place] take better care of you  by participating in Lung Screening.   Lung RADS Categories:  Lung RADS 1: no nodules or definitely non-concerning nodules.  Recommendation is for a repeat annual scan in 12 months.  Lung RADS 2:  nodules that are non-concerning in appearance and behavior with a very low likelihood of becoming an active cancer. Recommendation is for a repeat annual scan in 12 months.  Lung RADS 3: nodules that are probably non-concerning , includes nodules with a low likelihood of becoming an active cancer.  Recommendation is for a 40-month repeat screening scan. Often noted after an upper respiratory illness. We will be in touch to make sure you have no questions, and to schedule your 77-month scan.  Lung RADS 4 A: nodules with concerning findings, recommendation is most often for a follow up scan in 3 months or additional testing based on our provider's assessment of the scan. We will be in touch to make sure you have no questions and to schedule the recommended 3 month follow up scan.  Lung RADS 4 B:  indicates findings that are concerning. We will be in touch with you to schedule additional diagnostic testing based on our provider's  assessment of the scan.  You can receive free nicotine replacement therapy ( patches, gum or mints) by calling 1-800-QUIT NOW. Please call so we can get you on the path to becoming  a non-smoker. I know it is hard, but you can do this!  Other options for assistance in  smoking cessation ( As covered by your insurance benefits)  Hypnosis for smoking cessation  [employer] [phone]  Acupuncture for smoking cessation  [employer] [phone]

## 2023-06-19 NOTE — Progress Notes (Signed)
Virtual Visit via Telephone Note  I connected with Darlene Sanders on 06/19/23 at 10:00 AM EST by telephone and verified that I am speaking with the correct person using two identifiers.  Location: Patient:  At home Provider: [address] [place], [place], [address]    I discussed the limitations, risks, security and privacy concerns of performing an evaluation and management service by telephone and the availability of in person appointments. I also discussed with the patient that there may be a patient responsible charge related to this service. The patient expressed understanding and agreed to proceed.   Shared Decision Making Visit Lung Cancer Screening Program [phone])   Eligibility: Age 77 y.o. Pack Years Smoking History Calculation 44.25 pack years (# packs/per year x # years smoked) Recent History of coughing up blood  no Unexplained weight loss? no ( >Than 15 pounds within the last 6 months ) Prior History Lung / other cancer no (Diagnosis within the last 5 years already requiring surveillance chest CT Scans). Smoking Status Current Smoker Former Smokers: Years since quit:  NA  Quit Date:  NA  Visit Components: Discussion included one or more decision making aids. yes Discussion included risk/benefits of screening. yes Discussion included potential follow up diagnostic testing for abnormal scans. yes Discussion included meaning and risk of over diagnosis. yes Discussion included meaning and risk of False Positives. yes Discussion included meaning of total radiation exposure. yes  Counseling Included: Importance of adherence to annual lung cancer LDCT screening. yes Impact of comorbidities on ability to participate in the program. yes Ability and willingness to under diagnostic treatment. yes  Smoking Cessation Counseling: Current Smokers:  Discussed importance of smoking cessation. yes Information about tobacco cessation classes and interventions provided to  patient. yes Patient provided with "ticket" for LDCT Scan. yes Symptomatic Patient. no  Counseling NA Diagnosis Code: Tobacco Use Z72.0 Asymptomatic Patient yes  Counseling (Intermediate counseling: > three minutes counseling) U0454 Former Smokers:  Discussed the importance of maintaining cigarette abstinence. yes Diagnosis Code: Personal History of Nicotine Dependence. U98.119 Information about tobacco cessation classes and interventions provided to patient. Yes Patient provided with "ticket" for LDCT Scan. yes Written Order for Lung Cancer Screening with LDCT placed in Epic. Yes (CT Chest Lung Cancer Screening Low Dose W/O CM) [id number] Z12.2-Screening of respiratory organs Z87.891-Personal history of nicotine dependence   Bevelyn Ngo, NP 06/19/2023

## 2023-06-25 ENCOUNTER — Ambulatory Visit
Admission: RE | Admit: 2023-06-25 | Discharge: 2023-06-25 | Disposition: A | Discharge status: Home / Self Care | Payer: Medicare Other | Source: Ambulatory Visit | Attending: Acute Care | Admitting: Acute Care

## 2023-06-25 DIAGNOSIS — Z122 Encounter for screening for malignant neoplasm of respiratory organs: Secondary | ICD-10-CM

## 2023-06-25 DIAGNOSIS — Z87891 Personal history of nicotine dependence: Secondary | ICD-10-CM

## 2023-06-25 DIAGNOSIS — F17210 Nicotine dependence, cigarettes, uncomplicated: Secondary | ICD-10-CM

## 2023-07-08 DIAGNOSIS — Z1231 Encounter for screening mammogram for malignant neoplasm of breast: Secondary | ICD-10-CM | POA: Diagnosis not present

## 2023-07-24 ENCOUNTER — Other Ambulatory Visit: Payer: Self-pay | Admitting: Emergency Medicine

## 2023-07-24 DIAGNOSIS — Z122 Encounter for screening for malignant neoplasm of respiratory organs: Secondary | ICD-10-CM

## 2023-07-24 DIAGNOSIS — Z87891 Personal history of nicotine dependence: Secondary | ICD-10-CM

## 2023-07-24 DIAGNOSIS — F17210 Nicotine dependence, cigarettes, uncomplicated: Secondary | ICD-10-CM

## 2023-08-12 ENCOUNTER — Encounter (HOSPITAL_COMMUNITY): Payer: Self-pay

## 2023-08-12 ENCOUNTER — Inpatient Hospital Stay (HOSPITAL_COMMUNITY)
Admission: EM | Admit: 2023-08-12 | Discharge: 2023-08-15 | DRG: 193 | Disposition: A | Discharge status: Home / Self Care | Payer: Medicare Other | Attending: Family Medicine | Admitting: Family Medicine

## 2023-08-12 ENCOUNTER — Emergency Department (HOSPITAL_COMMUNITY): Payer: Medicare Other

## 2023-08-12 ENCOUNTER — Other Ambulatory Visit: Payer: Self-pay

## 2023-08-12 DIAGNOSIS — Z880 Allergy status to penicillin: Secondary | ICD-10-CM | POA: Diagnosis not present

## 2023-08-12 DIAGNOSIS — Z1152 Encounter for screening for COVID-19: Secondary | ICD-10-CM | POA: Diagnosis not present

## 2023-08-12 DIAGNOSIS — J189 Pneumonia, unspecified organism: Secondary | ICD-10-CM | POA: Diagnosis present

## 2023-08-12 DIAGNOSIS — Z79899 Other long term (current) drug therapy: Secondary | ICD-10-CM

## 2023-08-12 DIAGNOSIS — Z85820 Personal history of malignant melanoma of skin: Secondary | ICD-10-CM | POA: Diagnosis not present

## 2023-08-12 DIAGNOSIS — E860 Dehydration: Secondary | ICD-10-CM | POA: Diagnosis present

## 2023-08-12 DIAGNOSIS — E785 Hyperlipidemia, unspecified: Secondary | ICD-10-CM | POA: Diagnosis present

## 2023-08-12 DIAGNOSIS — R058 Other specified cough: Secondary | ICD-10-CM | POA: Diagnosis not present

## 2023-08-12 DIAGNOSIS — I10 Essential (primary) hypertension: Secondary | ICD-10-CM | POA: Diagnosis present

## 2023-08-12 DIAGNOSIS — R0602 Shortness of breath: Secondary | ICD-10-CM | POA: Diagnosis not present

## 2023-08-12 DIAGNOSIS — F419 Anxiety disorder, unspecified: Secondary | ICD-10-CM | POA: Diagnosis present

## 2023-08-12 DIAGNOSIS — Z91041 Radiographic dye allergy status: Secondary | ICD-10-CM | POA: Diagnosis not present

## 2023-08-12 DIAGNOSIS — E876 Hypokalemia: Secondary | ICD-10-CM | POA: Diagnosis present

## 2023-08-12 DIAGNOSIS — R0902 Hypoxemia: Secondary | ICD-10-CM | POA: Diagnosis not present

## 2023-08-12 DIAGNOSIS — R918 Other nonspecific abnormal finding of lung field: Secondary | ICD-10-CM | POA: Diagnosis not present

## 2023-08-12 DIAGNOSIS — J9601 Acute respiratory failure with hypoxia: Secondary | ICD-10-CM | POA: Diagnosis not present

## 2023-08-12 DIAGNOSIS — Z96641 Presence of right artificial hip joint: Secondary | ICD-10-CM | POA: Diagnosis present

## 2023-08-12 DIAGNOSIS — Z79890 Hormone replacement therapy (postmenopausal): Secondary | ICD-10-CM | POA: Diagnosis not present

## 2023-08-12 DIAGNOSIS — J440 Chronic obstructive pulmonary disease with acute lower respiratory infection: Secondary | ICD-10-CM | POA: Diagnosis present

## 2023-08-12 DIAGNOSIS — F32A Depression, unspecified: Secondary | ICD-10-CM | POA: Diagnosis present

## 2023-08-12 DIAGNOSIS — Z87891 Personal history of nicotine dependence: Secondary | ICD-10-CM | POA: Diagnosis not present

## 2023-08-12 DIAGNOSIS — Z91040 Latex allergy status: Secondary | ICD-10-CM | POA: Diagnosis not present

## 2023-08-12 DIAGNOSIS — Z7982 Long term (current) use of aspirin: Secondary | ICD-10-CM | POA: Diagnosis not present

## 2023-08-12 DIAGNOSIS — F17210 Nicotine dependence, cigarettes, uncomplicated: Secondary | ICD-10-CM | POA: Diagnosis present

## 2023-08-12 DIAGNOSIS — Z885 Allergy status to narcotic agent status: Secondary | ICD-10-CM | POA: Diagnosis not present

## 2023-08-12 DIAGNOSIS — Z6822 Body mass index (BMI) 22.0-22.9, adult: Secondary | ICD-10-CM | POA: Diagnosis not present

## 2023-08-12 DIAGNOSIS — B9689 Other specified bacterial agents as the cause of diseases classified elsewhere: Secondary | ICD-10-CM | POA: Diagnosis not present

## 2023-08-12 DIAGNOSIS — J208 Acute bronchitis due to other specified organisms: Secondary | ICD-10-CM | POA: Diagnosis not present

## 2023-08-12 DIAGNOSIS — I2510 Atherosclerotic heart disease of native coronary artery without angina pectoris: Secondary | ICD-10-CM | POA: Diagnosis not present

## 2023-08-12 DIAGNOSIS — E039 Hypothyroidism, unspecified: Secondary | ICD-10-CM | POA: Diagnosis present

## 2023-08-12 LAB — CBC WITH DIFFERENTIAL/PLATELET
Abs Immature Granulocytes: 0.03 10*3/uL (ref 0.00–0.07)
Basophils Absolute: 0.1 10*3/uL (ref 0.0–0.1)
Basophils Relative: 1 %
Eosinophils Absolute: 0 10*3/uL (ref 0.0–0.5)
Eosinophils Relative: 0 %
HCT: 48.7 % — ABNORMAL HIGH (ref 36.0–46.0)
Hemoglobin: 16.3 g/dL — ABNORMAL HIGH (ref 12.0–15.0)
Immature Granulocytes: 0 %
Lymphocytes Relative: 11 %
Lymphs Abs: 0.8 10*3/uL (ref 0.7–4.0)
MCH: 30.6 pg (ref 26.0–34.0)
MCHC: 33.5 g/dL (ref 30.0–36.0)
MCV: 91.5 fL (ref 80.0–100.0)
Monocytes Absolute: 0.8 10*3/uL (ref 0.1–1.0)
Monocytes Relative: 11 %
Neutro Abs: 5.6 10*3/uL (ref 1.7–7.7)
Neutrophils Relative %: 77 %
Platelets: 173 10*3/uL (ref 150–400)
RBC: 5.32 MIL/uL — ABNORMAL HIGH (ref 3.87–5.11)
RDW: 13.2 % (ref 11.5–15.5)
WBC: 7.2 10*3/uL (ref 4.0–10.5)
nRBC: 0 % (ref 0.0–0.2)

## 2023-08-12 LAB — RESP PANEL BY RT-PCR (RSV, FLU A&B, COVID)  RVPGX2
Influenza A by PCR: NEGATIVE
Influenza B by PCR: NEGATIVE
Resp Syncytial Virus by PCR: NEGATIVE
SARS Coronavirus 2 by RT PCR: NEGATIVE

## 2023-08-12 LAB — BASIC METABOLIC PANEL
Anion gap: 13 (ref 5–15)
BUN: 9 mg/dL (ref 8–23)
CO2: 25 mmol/L (ref 22–32)
Calcium: 9.8 mg/dL (ref 8.9–10.3)
Chloride: 95 mmol/L — ABNORMAL LOW (ref 98–111)
Creatinine, Ser: 0.66 mg/dL (ref 0.44–1.00)
GFR, Estimated: >60 mL/min (ref >60)
Glucose, Bld: 126 mg/dL — ABNORMAL HIGH (ref 70–99)
Potassium: 3.9 mmol/L (ref 3.5–5.1)
Sodium: 133 mmol/L — ABNORMAL LOW (ref 135–145)

## 2023-08-12 LAB — BRAIN NATRIURETIC PEPTIDE: B Natriuretic Peptide: 47.7 pg/mL (ref 0.0–100.0)

## 2023-08-12 LAB — D-DIMER, QUANTITATIVE: D-Dimer, Quant: 0.47 ug{FEU}/mL (ref 0.00–0.50)

## 2023-08-12 MED ORDER — IPRATROPIUM-ALBUTEROL 0.5-2.5 (3) MG/3ML IN SOLN
RESPIRATORY_TRACT | Status: AC
  Administered 2023-08-12: 3 mL via RESPIRATORY_TRACT
  Filled 2023-08-12: qty 3

## 2023-08-12 MED ORDER — IOHEXOL 350 MG/ML SOLN
80.0000 mL | Freq: Once | INTRAVENOUS | Status: AC | PRN
  Administered 2023-08-12: 80 mL via INTRAVENOUS

## 2023-08-12 MED ORDER — IPRATROPIUM-ALBUTEROL 0.5-2.5 (3) MG/3ML IN SOLN
3.0000 mL | Freq: Once | RESPIRATORY_TRACT | Status: AC
  Administered 2023-08-12: 3 mL via RESPIRATORY_TRACT
  Filled 2023-08-12: qty 3

## 2023-08-12 NOTE — ED Provider Triage Note (Signed)
Emergency Medicine Provider Triage Evaluation Note  Darlene Sanders , a 77 y.o. female  was evaluated in triage.  Pt complains of sob. Having cold sxs for 3 weeks, now with worsening sob and productive cough.  No fever, chills, body aches, no n/vd.  No hx of PE, no active CA.  Seen by PCP today, and sent here due to hypoxia  Review of Systems  Positive: As above Negative: As above  Physical Exam  BP (!) 145/83   Pulse (!) 112   Temp 98.4 F (36.9 C) (Oral)   Resp (!) 22   Ht 5\' 6"  (1.676 m)   Wt 62.6 kg   SpO2 94%   BMI 22.27 kg/m  Gen:   Awake, no distress   Resp:  Normal effort  MSK:   Moves extremities without difficulty  Other:    Medical Decision Making  Medically screening exam initiated at 3:30 PM.  Appropriate orders placed.  Darlene Sanders was informed that the remainder of the evaluation will be completed by another provider, this initial triage assessment does not replace that evaluation, and the importance of remaining in the ED until their evaluation is complete.     Darlene Helper, PA-C 08/12/23 1531

## 2023-08-12 NOTE — ED Provider Notes (Signed)
[place] EMERGENCY DEPARTMENT AT [facility] Provider Note   CSN: [id number] Arrival date & time: 08/12/23  1514     History  Chief Complaint  Patient presents with   Shortness of Breath    Darlene Sanders is a 77 y.o. female with a prior history of smoking presented to ED with concern for shortness of breath and congestion and productive cough ongoing for 3 to 4 weeks.  Patient reports NEPSY on exertion for several days, difficulty climbing stairs.  She went to see her PCP today who was concerned she was having hypoxia in the office told her to come to the ED.  She denies ongoing chest pain other than tightness when coughing.  She reports she still has frontal congestion and a dry cough.  She denies oxygen use in the past, she says she was a former smoker but quit.  She denies history of estrogen use, DVT or PE, prolonged immobilization, and no report of unilateral leg swelling  HPI     Home Medications Prior to Admission medications   Medication Sig Start Date End Date Taking? Authorizing Provider  cetirizine (ZYRTEC) 10 MG tablet Take 10 mg by mouth daily.    [provider]  citalopram (CELEXA) 40 MG tablet Take 40 mg by mouth daily.    [provider]  [facility] ASPIRIN ADULT LOW DOSE 81 MG chewable tablet TAKE 1 TABLET (81 MG TOTAL) BY MOUTH 2 (TWO) TIMES DAILY. 08/25/16   [provider]  cyanocobalamin 2000 MCG tablet Take 4,000 mcg by mouth daily.    [provider]  docusate sodium (COLACE) 100 MG capsule Take 1 capsule (100 mg total) by mouth 2 (two) times daily. Patient not taking: Reported on 11/05/2016 08/25/16   Cammy Copa, MD  losartan-hydrochlorothiazide [facility]) 50-12.5 MG tablet Take 1 tablet by mouth daily.    [provider]  methocarbamol (ROBAXIN) 500 MG tablet Take 1 tablet (500 mg total) by mouth every 6 (six) hours as needed for muscle spasms. Patient not taking: Reported on 11/05/2016 08/25/16   Cammy Copa, MD  mometasone (NASONEX) 50 MCG/ACT nasal spray Place 2 sprays into the nose daily.    [provider]  oxyCODONE (OXY IR/ROXICODONE) 5 MG immediate release tablet Take 1-2 tablets (5-10 mg total) by mouth every 3 (three) hours as needed for breakthrough pain. Patient not taking: Reported on 11/05/2016 08/25/16   Cammy Copa, MD  rosuvastatin (CRESTOR) 20 MG tablet Take 20 mg by mouth daily.    [provider]  SYNTHROID 88 MCG tablet Take 88 mcg by mouth daily before breakfast.  04/30/16   [provider]      Allergies    Codeine, Fluorescein, Hydromorphone, and Penicillins    Review of Systems   Review of Systems  Physical Exam Updated Vital Signs BP (!) 137/94 (BP Location: Left Arm)   Pulse 97   Temp 98.7 F (37.1 C) (Oral)   Resp (!) 22   Ht 5\' 6"  (1.676 m)   Wt 62.6 kg   SpO2 90%   BMI 22.27 kg/m  Physical Exam Constitutional:      General: She is not in acute distress. HENT:     Head: Normocephalic and atraumatic.  Eyes:     Conjunctiva/sclera: Conjunctivae normal.     Pupils: Pupils are equal, round, and reactive to light.  Cardiovascular:     Rate and Rhythm: Normal rate and regular rhythm.  Pulmonary:  Effort: Pulmonary effort is normal. No respiratory distress.     Comments: 91-93% on room air, rhonchi on bilaterla breath sounds,  Abdominal:     General: There is no distension.     Tenderness: There is no abdominal tenderness.  Skin:    General: Skin is warm and dry.  Neurological:     General: No focal deficit present.     Mental Status: She is alert. Mental status is at baseline.  Psychiatric:        Mood and Affect: Mood normal.        Behavior: Behavior normal.     ED Results / Procedures / Treatments   Labs (all labs ordered are listed, but only abnormal results are displayed) Labs Reviewed  BASIC METABOLIC PANEL - Abnormal; Notable for the following components:      Result Value   Sodium 133  (*)    Chloride 95 (*)    Glucose, Bld 126 (*)    All other components within normal limits  CBC WITH DIFFERENTIAL/PLATELET - Abnormal; Notable for the following components:   RBC 5.32 (*)    Hemoglobin 16.3 (*)    HCT 48.7 (*)    All other components within normal limits  RESP PANEL BY RT-PCR (RSV, FLU A&B, COVID)  RVPGX2  D-DIMER, QUANTITATIVE  BRAIN NATRIURETIC PEPTIDE    EKG EKG Interpretation Date/Time:  Monday August 12 2023 15:31:08 EST Ventricular Rate:  111 PR Interval:  154 QRS Duration:  73 QT Interval:  335 QTC Calculation: 456 R Axis:   57  Text Interpretation: Sinus tachycardia Probable left atrial enlargement Confirmed by Alvester Chou [phone]) on 08/12/2023 7:10:51 PM  Radiology DG Chest 2 View Result Date: 08/12/2023 CLINICAL DATA:  Shortness of breath. EXAM: CHEST - 2 VIEW COMPARISON:  02/27/2008. FINDINGS: Bilateral lung fields are clear. Bilateral costophrenic angles are clear. Normal cardio-mediastinal silhouette. No acute osseous abnormalities. The soft tissues are within normal limits. IMPRESSION: No active cardiopulmonary disease. Electronically Signed   By: Jules Schick M.D.   On: 08/12/2023 17:00    Procedures Procedures    Medications Ordered in ED Medications  ipratropium-albuterol (DUONEB) 0.5-2.5 (3) MG/3ML nebulizer solution (has no administration in time range)  ipratropium-albuterol (DUONEB) 0.5-2.5 (3) MG/3ML nebulizer solution 3 mL (3 mLs Nebulization Given 08/12/23 2020)  iohexol (OMNIPAQUE) 350 MG/ML injection 80 mL (80 mLs Intravenous Contrast Given 08/12/23 2305)    ED Course/ Medical Decision Making/ A&P                                 Medical Decision Making Amount and/or Complexity of Data Reviewed Labs: ordered. Radiology: ordered.  Risk Prescription drug management.   This patient presents to the ED with concern for cough, dyspnea, shortness of breath. This involves an extensive number of treatment options, and  is a complaint that carries with it a high risk of complications and morbidity.  The differential diagnosis includes anemia versus pneumonia versus fevers pleural effusion versus other  Co-morbidities that complicate the patient evaluation: History of COPD   I ordered and personally interpreted labs.  The pertinent results include: No emergent findings  I ordered imaging studies including x-ray of the chest, CT PE I independently visualized and interpreted imaging which showed PE pending at the time of signout, no acute emergent process noted on x-ray I agree with the radiologist interpretation  The patient was maintained on a cardiac monitor.  I personally viewed and interpreted the cardiac monitored which showed an underlying rhythm of: Sinus rhythm  Per my interpretation the patient's ECG shows no acute ischemic findings  I ordered medication including neb for wheezing  I have reviewed the patients home medicines and have made adjustments as needed  After the interventions noted above, I reevaluated the patient and found that they have: stayed the same  Patient had no hypoxia in the room, maintaining SPO2 above 90% including with ambulatory pulse ox.  I did note that she had some difficulty with accurate pulse oximeter reading here initially and wonder whether her outpatient measurements were accurate, as reported hypoxia in the office.   Dispostion:  Patient is signed out to Dr Blinda Leatherwood EDP pending follow up on CT imaging.  If no emergent findings evidence that the patient could be discharged home         Final Clinical Impression(s) / ED Diagnoses Final diagnoses:  None    Rx / DC Orders ED Discharge Orders     None         Terald Sleeper, MD 08/12/23 2338

## 2023-08-12 NOTE — ED Triage Notes (Signed)
Pt presenting from her PCP, concerned pt has viral pneumonia. Pt complaining of intermittent SOB for the last 3-4 wk, cough, and chills. Pt has pulse oxymetry reading of 89% at PCP.

## 2023-08-13 DIAGNOSIS — Z1152 Encounter for screening for COVID-19: Secondary | ICD-10-CM | POA: Diagnosis not present

## 2023-08-13 DIAGNOSIS — F32A Depression, unspecified: Secondary | ICD-10-CM | POA: Diagnosis present

## 2023-08-13 DIAGNOSIS — Z85820 Personal history of malignant melanoma of skin: Secondary | ICD-10-CM | POA: Diagnosis not present

## 2023-08-13 DIAGNOSIS — J189 Pneumonia, unspecified organism: Secondary | ICD-10-CM | POA: Diagnosis present

## 2023-08-13 DIAGNOSIS — J9601 Acute respiratory failure with hypoxia: Secondary | ICD-10-CM | POA: Diagnosis present

## 2023-08-13 DIAGNOSIS — F17210 Nicotine dependence, cigarettes, uncomplicated: Secondary | ICD-10-CM | POA: Diagnosis present

## 2023-08-13 DIAGNOSIS — Z885 Allergy status to narcotic agent status: Secondary | ICD-10-CM | POA: Diagnosis not present

## 2023-08-13 DIAGNOSIS — E876 Hypokalemia: Secondary | ICD-10-CM | POA: Diagnosis present

## 2023-08-13 DIAGNOSIS — Z91041 Radiographic dye allergy status: Secondary | ICD-10-CM | POA: Diagnosis not present

## 2023-08-13 DIAGNOSIS — Z880 Allergy status to penicillin: Secondary | ICD-10-CM | POA: Diagnosis not present

## 2023-08-13 DIAGNOSIS — E039 Hypothyroidism, unspecified: Secondary | ICD-10-CM | POA: Diagnosis present

## 2023-08-13 DIAGNOSIS — Z7982 Long term (current) use of aspirin: Secondary | ICD-10-CM | POA: Diagnosis not present

## 2023-08-13 DIAGNOSIS — J440 Chronic obstructive pulmonary disease with acute lower respiratory infection: Secondary | ICD-10-CM | POA: Diagnosis present

## 2023-08-13 DIAGNOSIS — I10 Essential (primary) hypertension: Secondary | ICD-10-CM | POA: Diagnosis present

## 2023-08-13 DIAGNOSIS — Z79890 Hormone replacement therapy (postmenopausal): Secondary | ICD-10-CM | POA: Diagnosis not present

## 2023-08-13 DIAGNOSIS — E860 Dehydration: Secondary | ICD-10-CM | POA: Diagnosis present

## 2023-08-13 DIAGNOSIS — Z91040 Latex allergy status: Secondary | ICD-10-CM | POA: Diagnosis not present

## 2023-08-13 DIAGNOSIS — Z96641 Presence of right artificial hip joint: Secondary | ICD-10-CM | POA: Diagnosis present

## 2023-08-13 DIAGNOSIS — Z79899 Other long term (current) drug therapy: Secondary | ICD-10-CM | POA: Diagnosis not present

## 2023-08-13 DIAGNOSIS — E785 Hyperlipidemia, unspecified: Secondary | ICD-10-CM | POA: Diagnosis present

## 2023-08-13 DIAGNOSIS — F419 Anxiety disorder, unspecified: Secondary | ICD-10-CM | POA: Diagnosis present

## 2023-08-13 LAB — BASIC METABOLIC PANEL
Anion gap: 9 (ref 5–15)
BUN: 9 mg/dL (ref 8–23)
CO2: 22 mmol/L (ref 22–32)
Calcium: 8.6 mg/dL — ABNORMAL LOW (ref 8.9–10.3)
Chloride: 98 mmol/L (ref 98–111)
Creatinine, Ser: 0.61 mg/dL (ref 0.44–1.00)
GFR, Estimated: >60 mL/min (ref >60)
Glucose, Bld: 112 mg/dL — ABNORMAL HIGH (ref 70–99)
Potassium: 3.2 mmol/L — ABNORMAL LOW (ref 3.5–5.1)
Sodium: 129 mmol/L — ABNORMAL LOW (ref 135–145)

## 2023-08-13 LAB — EXPECTORATED SPUTUM ASSESSMENT W GRAM STAIN, RFLX TO RESP C: Special Requests: NORMAL

## 2023-08-13 LAB — CBC
HCT: 43.9 % (ref 36.0–46.0)
Hemoglobin: 14.6 g/dL (ref 12.0–15.0)
MCH: 30.4 pg (ref 26.0–34.0)
MCHC: 33.3 g/dL (ref 30.0–36.0)
MCV: 91.5 fL (ref 80.0–100.0)
Platelets: 165 10*3/uL (ref 150–400)
RBC: 4.8 MIL/uL (ref 3.87–5.11)
RDW: 13.2 % (ref 11.5–15.5)
WBC: 7.8 10*3/uL (ref 4.0–10.5)
nRBC: 0 % (ref 0.0–0.2)

## 2023-08-13 LAB — I-STAT CG4 LACTIC ACID, ED
Lactic Acid, Venous: 0.9 mmol/L (ref 0.5–1.9)
Lactic Acid, Venous: 0.6 mmol/L (ref 0.5–1.9)

## 2023-08-13 LAB — PHOSPHORUS: Phosphorus: 3.2 mg/dL (ref 2.5–4.6)

## 2023-08-13 LAB — MAGNESIUM: Magnesium: 1.5 mg/dL — ABNORMAL LOW (ref 1.7–2.4)

## 2023-08-13 MED ORDER — VITAMIN B-12 1000 MCG PO TABS
4000.0000 ug | ORAL_TABLET | ORAL | Status: DC
  Administered 2023-08-13 – 2023-08-15 (×2): 4000 ug via ORAL
  Filled 2023-08-13 (×2): qty 4

## 2023-08-13 MED ORDER — GUAIFENESIN ER 600 MG PO TB12
600.0000 mg | ORAL_TABLET | Freq: Two times a day (BID) | ORAL | Status: DC
  Administered 2023-08-13 – 2023-08-15 (×5): 600 mg via ORAL
  Filled 2023-08-13 (×5): qty 1

## 2023-08-13 MED ORDER — SODIUM CHLORIDE 0.9 % IV SOLN
500.0000 mg | INTRAVENOUS | Status: DC
  Administered 2023-08-14 – 2023-08-15 (×2): 500 mg via INTRAVENOUS
  Filled 2023-08-13 (×2): qty 5

## 2023-08-13 MED ORDER — SODIUM CHLORIDE 0.9 % IV SOLN
1.0000 g | INTRAVENOUS | Status: DC
  Administered 2023-08-14 – 2023-08-15 (×2): 1 g via INTRAVENOUS
  Filled 2023-08-13 (×2): qty 10

## 2023-08-13 MED ORDER — ROSUVASTATIN CALCIUM 10 MG PO TABS
20.0000 mg | ORAL_TABLET | Freq: Every day | ORAL | Status: DC
  Administered 2023-08-13 – 2023-08-15 (×3): 20 mg via ORAL
  Filled 2023-08-13: qty 1
  Filled 2023-08-13 (×2): qty 2

## 2023-08-13 MED ORDER — METHYLPREDNISOLONE SODIUM SUCC 40 MG IJ SOLR
40.0000 mg | Freq: Every day | INTRAMUSCULAR | Status: DC
  Administered 2023-08-14 – 2023-08-15 (×2): 40 mg via INTRAVENOUS
  Filled 2023-08-13 (×2): qty 1

## 2023-08-13 MED ORDER — MAGNESIUM SULFATE 2 GM/50ML IV SOLN
2.0000 g | Freq: Once | INTRAVENOUS | Status: AC
  Administered 2023-08-13: 2 g via INTRAVENOUS
  Filled 2023-08-13: qty 50

## 2023-08-13 MED ORDER — PROCHLORPERAZINE EDISYLATE 10 MG/2ML IJ SOLN: 5.0000 mg | Freq: Four times a day (QID) | INTRAMUSCULAR | Status: DC | PRN

## 2023-08-13 MED ORDER — ACETAMINOPHEN 325 MG PO TABS: 650.0000 mg | ORAL_TABLET | Freq: Four times a day (QID) | ORAL | Status: DC | PRN

## 2023-08-13 MED ORDER — ENOXAPARIN SODIUM 40 MG/0.4ML IJ SOSY
40.0000 mg | PREFILLED_SYRINGE | INTRAMUSCULAR | Status: DC
  Administered 2023-08-13 – 2023-08-15 (×3): 40 mg via SUBCUTANEOUS
  Filled 2023-08-13 (×3): qty 0.4

## 2023-08-13 MED ORDER — POLYETHYLENE GLYCOL 3350 17 G PO PACK: 17.0000 g | PACK | Freq: Every day | ORAL | Status: DC | PRN

## 2023-08-13 MED ORDER — IPRATROPIUM-ALBUTEROL 0.5-2.5 (3) MG/3ML IN SOLN
3.0000 mL | Freq: Four times a day (QID) | RESPIRATORY_TRACT | Status: DC
  Administered 2023-08-13 (×2): 3 mL via RESPIRATORY_TRACT
  Filled 2023-08-13 (×2): qty 3

## 2023-08-13 MED ORDER — LEVOTHYROXINE SODIUM 88 MCG PO TABS
88.0000 ug | ORAL_TABLET | Freq: Every day | ORAL | Status: DC
  Administered 2023-08-13 – 2023-08-15 (×3): 88 ug via ORAL
  Filled 2023-08-13 (×3): qty 1

## 2023-08-13 MED ORDER — SODIUM CHLORIDE 0.9 % IV SOLN
1.0000 g | Freq: Once | INTRAVENOUS | Status: AC
  Administered 2023-08-13: 1 g via INTRAVENOUS
  Filled 2023-08-13: qty 10

## 2023-08-13 MED ORDER — CITALOPRAM HYDROBROMIDE 20 MG PO TABS
20.0000 mg | ORAL_TABLET | Freq: Every day | ORAL | Status: DC
  Administered 2023-08-13 – 2023-08-15 (×3): 20 mg via ORAL
  Filled 2023-08-13 (×2): qty 1
  Filled 2023-08-13: qty 2

## 2023-08-13 MED ORDER — POTASSIUM CHLORIDE IN NACL 20-0.9 MEQ/L-% IV SOLN
INTRAVENOUS | Status: AC
  Filled 2023-08-13: qty 1000

## 2023-08-13 MED ORDER — IPRATROPIUM-ALBUTEROL 0.5-2.5 (3) MG/3ML IN SOLN
3.0000 mL | Freq: Three times a day (TID) | RESPIRATORY_TRACT | Status: DC
Start: 2023-08-13 — End: 2023-08-14
  Administered 2023-08-13 – 2023-08-14 (×2): 3 mL via RESPIRATORY_TRACT
  Filled 2023-08-13 (×2): qty 3

## 2023-08-13 MED ORDER — SODIUM CHLORIDE 3 % IN NEBU
4.0000 mL | INHALATION_SOLUTION | Freq: Two times a day (BID) | RESPIRATORY_TRACT | Status: DC
  Administered 2023-08-13 – 2023-08-15 (×5): 4 mL via RESPIRATORY_TRACT
  Filled 2023-08-13 (×5): qty 4

## 2023-08-13 MED ORDER — POTASSIUM CHLORIDE 20 MEQ PO PACK
40.0000 meq | PACK | Freq: Once | ORAL | Status: AC
  Administered 2023-08-13: 40 meq via ORAL
  Filled 2023-08-13: qty 2

## 2023-08-13 MED ORDER — METHYLPREDNISOLONE SODIUM SUCC 125 MG IJ SOLR
125.0000 mg | Freq: Once | INTRAMUSCULAR | Status: AC
  Administered 2023-08-13: 125 mg via INTRAVENOUS
  Filled 2023-08-13: qty 2

## 2023-08-13 MED ORDER — MELATONIN 5 MG PO TABS: 5.0000 mg | ORAL_TABLET | Freq: Every evening | ORAL | Status: DC | PRN

## 2023-08-13 MED ORDER — SODIUM CHLORIDE 0.9 % IV SOLN
500.0000 mg | Freq: Once | INTRAVENOUS | Status: AC
  Administered 2023-08-13: 500 mg via INTRAVENOUS
  Filled 2023-08-13: qty 5

## 2023-08-13 NOTE — Care Plan (Signed)
 This 77 years old female with PMH significant for hypertension, hyperlipidemia, hypothyroidism, chronic anxiety/depression presented to the ED with complaints of shortness of breath for last 3 to 4 days associated with productive cough and chills. Patient initially presented to her PCP where she was noted to be hypoxic with SpO2 of 80% on room air.  Patient was placed on supplemental oxygen and was sent in the ED.  She does not use oxygen at baseline.  CT angio was negative for pulmonary embolism however it showed findings suggestive of atypical pneumonia.  Patient admitted for further evaluation and started on IV Rocephin  and Zithromax . Patient was seen and examined at bedside. She reports feeling better.

## 2023-08-13 NOTE — ED Provider Notes (Signed)
 Signed out to me with CT pending.  Patient seen for shortness of breath and hypoxia documented at her PCP office.  Patient with history of COPD.  She has had increased cough.  CT scan does not show evidence of PE.  It does, however, show evidence of atypical pneumonia.  Patient currently requiring 4 L oxygen by nasal cannula to maintain oxygen saturations.  She does not use oxygen at home.  She was ambulated earlier but tells me that they left her oxygen on for the ambulation test.  She took her oxygen off after that to go to the bathroom and her oxygen saturations were 82% when she got back to her room.  She will require hospitalization for further management of community-acquired pneumonia and COPD exacerbation.   Haze Lonni PARAS, MD 08/13/23 [phone]

## 2023-08-13 NOTE — ED Notes (Signed)
Provided pt with cup for sputum sample collection

## 2023-08-13 NOTE — ED Notes (Signed)
 ED TO INPATIENT HANDOFF REPORT  Name/Age/Gender Darlene Sanders 77 y.o. female  Code Status    Code Status Orders  (From admission, onward)           Start     Ordered   08/13/23 0305  Full code  Continuous       Question:  By:  Answer:  Consent: discussion documented in EHR   08/13/23 0304           Code Status History     Date Active Date Inactive Code Status Order ID Comments User Context   08/24/2016 1019 08/25/2016 1652 Full Code [id number]  Vernetta Lonni GRADE, MD Inpatient       Home/SNF/Other Home  Chief Complaint Acute hypoxic respiratory failure (HCC) [J96.01]  Level of Care/Admitting Diagnosis ED Disposition     ED Disposition  Admit   Condition  --   Comment  Hospital Area: [facility] [[id number]]  Level of Care: Telemetry [5]  Admit to tele based on following criteria: Monitor for Ischemic changes  May admit patient to [facility] or [facility] if equivalent level of care is available:: Yes  Covid Evaluation: Asymptomatic - no recent exposure (last 10 days) testing not required  Diagnosis: Acute hypoxic respiratory failure [facility]) [[id number]]  Admitting Physician: SHONA TERRY SAILOR [[id number]]  Attending Physician: SHONA TERRY SAILOR [[id number]]  Certification:: I certify this patient will need inpatient services for at least 2 midnights  Expected Medical Readiness: 08/15/2023          Medical History Past Medical History:  Diagnosis Date   Arthritis    osteoarthiritis related   Asthma    many years ago, age 52   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 ; MOHs   Depression    Diverticulitis    Headache    Hypertension    Hypothyroidism     Allergies Allergies  Allergen Reactions   Codeine Nausea Only   Fluorescein Other (See Comments)    Pt says she passed out   Hydromorphone  Nausea Only   Latex    Penicillins Rash    Has patient had a PCN reaction causing immediate rash, facial/tongue/throat swelling, SOB or  lightheadedness with hypotension: unknown Has patient had a PCN reaction causing severe rash involving mucus membranes or skin necrosis: Yes Has patient had a PCN reaction that required hospitalization No Has patient had a PCN reaction occurring within the last 10 years: No If all of the above answers are NO, then may proceed with Cephalosporin use.     IV Location/Drains/Wounds Patient Lines/Drains/Airways Status     Active Line/Drains/Airways     Name Placement date Placement time Site Days   Peripheral IV 08/12/23 20 G Anterior;Left Forearm 08/12/23  2100  Forearm  1   Peripheral IV 08/13/23 20 G 1.16 Anterior;Right Forearm 08/13/23  0250  Forearm  less than 1            Labs/Imaging Results for orders placed or performed during the hospital encounter of 08/12/23 (from the past 48 hours)  Basic metabolic panel     Status: Abnormal   Collection Time: 08/12/23  3:37 PM  Result Value Ref Range   Sodium 133 (L) 135 - 145 mmol/L   Potassium 3.9 3.5 - 5.1 mmol/L   Chloride 95 (L) 98 - 111 mmol/L   CO2 25 22 - 32 mmol/L   Glucose, Bld 126 (H) 70 - 99 mg/dL    Comment: Glucose reference  range applies only to samples taken after fasting for at least 8 hours.   BUN 9 8 - 23 mg/dL   Creatinine, Ser 9.33 0.44 - 1.00 mg/dL   Calcium  9.8 8.9 - 10.3 mg/dL   GFR, Estimated >39 >39 mL/min    Comment: (NOTE) Calculated using the CKD-EPI Creatinine Equation (2021)    Anion gap 13 5 - 15    Comment: Performed at [facility], [address] [place], [place] [address]  CBC with Differential     Status: Abnormal   Collection Time: 08/12/23  3:37 PM  Result Value Ref Range   WBC 7.2 4.0 - 10.5 K/uL   RBC 5.32 (H) 3.87 - 5.11 MIL/uL   Hemoglobin 16.3 (H) 12.0 - 15.0 g/dL   HCT 51.2 (H) 63.9 - 53.9 %   MCV 91.5 80.0 - 100.0 fL   MCH 30.6 26.0 - 34.0 pg   MCHC 33.5 30.0 - 36.0 g/dL   RDW 86.7 88.4 - 84.4 %   Platelets 173 150 - 400 K/uL   nRBC 0.0 0.0 - 0.2 %    Neutrophils Relative % 77 %   Neutro Abs 5.6 1.7 - 7.7 K/uL   Lymphocytes Relative 11 %   Lymphs Abs 0.8 0.7 - 4.0 K/uL   Monocytes Relative 11 %   Monocytes Absolute 0.8 0.1 - 1.0 K/uL   Eosinophils Relative 0 %   Eosinophils Absolute 0.0 0.0 - 0.5 K/uL   Basophils Relative 1 %   Basophils Absolute 0.1 0.0 - 0.1 K/uL   Immature Granulocytes 0 %   Abs Immature Granulocytes 0.03 0.00 - 0.07 K/uL    Comment: Performed at [facility], [address], [place], [place] [address]  D-dimer, quantitative     Status: None   Collection Time: 08/12/23  3:37 PM  Result Value Ref Range   D-Dimer, Quant 0.47 0.00 - 0.50 ug/mL-FEU    Comment: (NOTE) At the manufacturer cut-off value of 0.5 g/mL FEU, this assay has a negative predictive value of 95-100%.This assay is intended for use in conjunction with a clinical pretest probability (PTP) assessment model to exclude pulmonary embolism (PE) and deep venous thrombosis (DVT) in outpatients suspected of PE or DVT. Results should be correlated with clinical presentation. Performed at [facility], [address], [place], [place] [address]   Brain natriuretic peptide     Status: None   Collection Time: 08/12/23  8:12 PM  Result Value Ref Range   B Natriuretic Peptide 47.7 0.0 - 100.0 pg/mL    Comment: Performed at [facility], [address] [place], [place] [address]  Resp panel by RT-PCR (RSV, Flu A&B, Covid) Anterior Nasal Swab     Status: None   Collection Time: 08/12/23  8:12 PM   Specimen: Anterior Nasal Swab  Result Value Ref Range   SARS Coronavirus 2 by RT PCR NEGATIVE NEGATIVE    Comment: (NOTE) SARS-CoV-2 target nucleic acids are NOT DETECTED.  The SARS-CoV-2 RNA is generally detectable in upper respiratory specimens during the acute phase of infection. The lowest concentration of SARS-CoV-2 viral copies this assay can detect is 138 copies/mL. A negative result does not  preclude SARS-Cov-2 infection and should not be used as the sole basis for treatment or other patient management decisions. A negative result may occur with  improper specimen collection/handling, submission of specimen other than nasopharyngeal swab, presence of viral mutation(s) within the areas targeted by this assay, and  inadequate number of viral copies(<138 copies/mL). A negative result must be combined with clinical observations, patient history, and epidemiological information. The expected result is Negative.  Fact Sheet for Patients:  bloggercourse.com  Fact Sheet for Healthcare Providers:  seriousbroker.it  This test is no t yet approved or cleared by the [place]  FDA and  has been authorized for detection and/or diagnosis of SARS-CoV-2 by FDA under an Emergency Use Authorization (EUA). This EUA will remain  in effect (meaning this test can be used) for the duration of the COVID-19 declaration under Section 564(b)(1) of the Act, 21 U.S.C.section 360bbb-3(b)(1), unless the authorization is terminated  or revoked sooner.       Influenza A by PCR NEGATIVE NEGATIVE   Influenza B by PCR NEGATIVE NEGATIVE    Comment: (NOTE) The Xpert Xpress SARS-CoV-2/FLU/RSV plus assay is intended as an aid in the diagnosis of influenza from Nasopharyngeal swab specimens and should not be used as a sole basis for treatment. Nasal washings and aspirates are unacceptable for Xpert Xpress SARS-CoV-2/FLU/RSV testing.  Fact Sheet for Patients: bloggercourse.com  Fact Sheet for Healthcare Providers: seriousbroker.it  This test is not yet approved or cleared by the [place]  FDA and has been authorized for detection and/or diagnosis of SARS-CoV-2 by FDA under an Emergency Use Authorization (EUA). This EUA will remain in effect (meaning this test can be used) for the duration of  the COVID-19 declaration under Section 564(b)(1) of the Act, 21 U.S.C. section 360bbb-3(b)(1), unless the authorization is terminated or revoked.     Resp Syncytial Virus by PCR NEGATIVE NEGATIVE    Comment: (NOTE) Fact Sheet for Patients: bloggercourse.com  Fact Sheet for Healthcare Providers: seriousbroker.it  This test is not yet approved or cleared by the [place]  FDA and has been authorized for detection and/or diagnosis of SARS-CoV-2 by FDA under an Emergency Use Authorization (EUA). This EUA will remain in effect (meaning this test can be used) for the duration of the COVID-19 declaration under Section 564(b)(1) of the Act, 21 U.S.C. section 360bbb-3(b)(1), unless the authorization is terminated or revoked.  Performed at [facility], [address], [place], [place] [address]   Culture, blood (Routine X 2) w Reflex to ID Panel     Status: None (Preliminary result)   Collection Time: 08/13/23  2:55 AM   Specimen: BLOOD LEFT ARM  Result Value Ref Range   Specimen Description      BLOOD LEFT ARM Performed at [facility], [address], [place], [place] [address]    Special Requests      BOTTLES DRAWN AEROBIC AND ANAEROBIC Blood Culture results may not be optimal due to an inadequate volume of blood received in culture bottles Performed at [facility], [address], [place], [place] [address]    Culture      NO GROWTH < 12 HOURS Performed at [facility], [address], [place], [place] [address]    Report Status PENDING   CBC     Status: None   Collection Time: 08/13/23  3:07 AM  Result Value Ref Range   WBC 7.8 4.0 - 10.5 K/uL   RBC 4.80 3.87 - 5.11 MIL/uL   Hemoglobin 14.6 12.0 - 15.0 g/dL   HCT 56.0 63.9 - 53.9 %   MCV 91.5 80.0 - 100.0 fL   MCH 30.4 26.0 - 34.0 pg   MCHC 33.3 30.0 - 36.0 g/dL   RDW 86.7 88.4 - 84.4 %  Platelets 165  150 - 400 K/uL   nRBC 0.0 0.0 - 0.2 %    Comment: Performed at [facility], [address], [place], [place] [address]  Basic metabolic panel     Status: Abnormal   Collection Time: 08/13/23  3:07 AM  Result Value Ref Range   Sodium 129 (L) 135 - 145 mmol/L   Potassium 3.2 (L) 3.5 - 5.1 mmol/L   Chloride 98 98 - 111 mmol/L   CO2 22 22 - 32 mmol/L   Glucose, Bld 112 (H) 70 - 99 mg/dL    Comment: Glucose reference range applies only to samples taken after fasting for at least 8 hours.   BUN 9 8 - 23 mg/dL   Creatinine, Ser 9.38 0.44 - 1.00 mg/dL   Calcium  8.6 (L) 8.9 - 10.3 mg/dL   GFR, Estimated >39 >39 mL/min    Comment: (NOTE) Calculated using the CKD-EPI Creatinine Equation (2021)    Anion gap 9 5 - 15    Comment: Performed at [facility], [address] [place], [place] [address]  Magnesium      Status: Abnormal   Collection Time: 08/13/23  3:07 AM  Result Value Ref Range   Magnesium  1.5 (L) 1.7 - 2.4 mg/dL    Comment: Performed at [facility], [address], [place], [place] [address]  Phosphorus     Status: None   Collection Time: 08/13/23  3:07 AM  Result Value Ref Range   Phosphorus 3.2 2.5 - 4.6 mg/dL    Comment: Performed at [facility], [address] [place], [place] [address]  Culture, blood (Routine X 2) w Reflex to ID Panel     Status: None (Preliminary result)   Collection Time: 08/13/23  3:11 AM   Specimen: BLOOD RIGHT FOREARM  Result Value Ref Range   Specimen Description      BLOOD RIGHT FOREARM Performed at [facility], [address], [place], [place] [address]    Special Requests      BOTTLES DRAWN AEROBIC AND ANAEROBIC Blood Culture results may not be optimal due to an inadequate volume of blood received in culture bottles Performed at [facility], [address], [place], [place] [address]    Culture      NO GROWTH < 12  HOURS Performed at [facility], [address] [place], [place] [address]    Report Status PENDING   I-Stat CG4 Lactic Acid     Status: None   Collection Time: 08/13/23  3:18 AM  Result Value Ref Range   Lactic Acid, Venous 0.9 0.5 - 1.9 mmol/L  I-Stat CG4 Lactic Acid     Status: None   Collection Time: 08/13/23  5:17 AM  Result Value Ref Range   Lactic Acid, Venous 0.6 0.5 - 1.9 mmol/L  Expectorated Sputum Assessment w Gram Stain, Rflx to Resp Cult     Status: None   Collection Time: 08/13/23 11:33 AM   Specimen: Sputum  Result Value Ref Range   Specimen Description SPUTUM    Special Requests Normal    Sputum evaluation      THIS SPECIMEN IS ACCEPTABLE FOR SPUTUM CULTURE Performed at [facility], [address] [place], [place] [address]    Report Status 08/13/2023 FINAL    CT Angio Chest PE W and/or Wo Contrast Result Date: 08/13/2023 CLINICAL DATA:  Pulmonary embolism (PE) suspected, high prob Pt complains  of sob. Having cold sxs for 3 weeks, now with worsening sob and productive cough. No fever, chills, body aches, no n/vd. No hx of PE, no active CA. Seen by PCP today, and sent here due to hypoxia EXAM: CT ANGIOGRAPHY CHEST WITH CONTRAST TECHNIQUE: Multidetector CT imaging of the chest was performed using the standard protocol during bolus administration of intravenous contrast. Multiplanar CT image reconstructions and MIPs were obtained to evaluate the vascular anatomy. RADIATION DOSE REDUCTION: This exam was performed according to the departmental dose-optimization program which includes automated exposure control, adjustment of the mA and/or kV according to patient size and/or use of iterative reconstruction technique. CONTRAST:  80mL OMNIPAQUE  IOHEXOL  350 MG/ML SOLN COMPARISON:  CT chest 06/25/2023 FINDINGS: Cardiovascular: Satisfactory opacification of the pulmonary arteries to the segmental level. No evidence of pulmonary embolism. Normal heart size.  No significant pericardial effusion. The thoracic aorta is normal in caliber. Moderate atherosclerotic plaque of the thoracic aorta. At least 2 vessel coronary artery calcifications. Mediastinum/Nodes: Prominent but nonenlarged mediastinal lymph nodes. No enlarged mediastinal, hilar, or axillary lymph nodes. Thyroid  gland, trachea, and esophagus demonstrate no significant findings. Small hiatal hernia. Lungs/Pleura: Diffuse bronchial wall thickening with developing peribronchovascular tree-in-bud nodularities. No focal consolidation. No pulmonary nodule. No pulmonary mass. No pleural effusion. No pneumothorax. Upper Abdomen: Fluid density lesion likely represents a simple renal cyst. Simple renal cysts, in the absence of clinically indicated signs/symptoms, require no independent follow-up. Subcentimeter density too small to characterize-no further follow-up indicated. Colonic diverticulosis. Musculoskeletal: No chest wall abnormality. No suspicious lytic or blastic osseous lesions. No acute displaced fracture. Multilevel degenerative changes of the spine. Review of the MIP images confirms the above findings. IMPRESSION: 1. No pulmonary embolus. 2. Diffuse bronchial wall thickening with developing peribronchovascular tree-in-bud nodularities. Findings suggestive of developing atypical pneumonia. 3. Small hiatal hernia. 4. Aortic Atherosclerosis (ICD10-I70.0) including coronary artery calcification. Electronically Signed   By: Morgane  Naveau M.D.   On: 08/13/2023 00:53   DG Chest 2 View Result Date: 08/12/2023 CLINICAL DATA:  Shortness of breath. EXAM: CHEST - 2 VIEW COMPARISON:  02/27/2008. FINDINGS: Bilateral lung fields are clear. Bilateral costophrenic angles are clear. Normal cardio-mediastinal silhouette. No acute osseous abnormalities. The soft tissues are within normal limits. IMPRESSION: No active cardiopulmonary disease. Electronically Signed   By: Ree Molt M.D.   On: 08/12/2023 17:00     Pending Labs Unresulted Labs (From admission, onward)     Start     Ordered   08/20/23 0500  Creatinine, serum  (enoxaparin  (LOVENOX )    CrCl >/= 30 ml/min)  Weekly,   R     Comments: while on enoxaparin  therapy    08/13/23 0304   08/14/23 0500  CBC  Tomorrow morning,   R        08/13/23 1444   08/14/23 0500  Magnesium   Tomorrow morning,   R        08/13/23 1444   08/14/23 0500  Basic metabolic panel  Tomorrow morning,   R        08/13/23 1444   08/14/23 0500  Phosphorus  Tomorrow morning,   R        08/13/23 1444   08/13/23 1133  Culture, Respiratory w Gram Stain  Once,   R        08/13/23 1133            Vitals/Pain Today's Vitals   08/13/23 1236 08/13/23 1409 08/13/23 1632 08/13/23 2000  BP: 118/76  (!) 105/91 129/73  Pulse: 69  88 82  Resp: 20  14 19   Temp: 98 F (36.7 C)  98.6 F (37 C)   TempSrc: Oral  Oral   SpO2: 94% 96% 97% 96%  Weight:      Height:      PainSc:        Isolation Precautions No active isolations  Medications Medications  enoxaparin  (LOVENOX ) injection 40 mg (40 mg Subcutaneous Given 08/13/23 1034)  citalopram  (CELEXA ) tablet 20 mg (20 mg Oral Given 08/13/23 1032)  cyanocobalamin  (VITAMIN B12) tablet 4,000 mcg (4,000 mcg Oral Given 08/13/23 1031)  rosuvastatin  (CRESTOR ) tablet 20 mg (20 mg Oral Given 08/13/23 1032)  levothyroxine  (SYNTHROID ) tablet 88 mcg (88 mcg Oral Given 08/13/23 0559)  methylPREDNISolone  sodium succinate (SOLU-MEDROL ) 40 mg/mL injection 40 mg (has no administration in time range)  cefTRIAXone  (ROCEPHIN ) 1 g in sodium chloride  0.9 % 100 mL IVPB (has no administration in time range)  azithromycin  (ZITHROMAX ) 500 mg in sodium chloride  0.9 % 250 mL IVPB (has no administration in time range)  acetaminophen  (TYLENOL ) tablet 650 mg (has no administration in time range)  melatonin tablet 5 mg (has no administration in time range)  polyethylene glycol (MIRALAX  / GLYCOLAX ) packet 17 g (has no administration in time  range)  prochlorperazine  (COMPAZINE ) injection 5 mg (has no administration in time range)  0.9 % NaCl with KCl 20 mEq/ L  infusion ( Intravenous New Bag/Given 08/13/23 0848)  guaiFENesin  (MUCINEX ) 12 hr tablet 600 mg (600 mg Oral Given 08/13/23 0645)  sodium chloride  HYPERTONIC 3 % nebulizer solution 4 mL (4 mLs Nebulization Given 08/13/23 0731)  ipratropium-albuterol  (DUONEB) 0.5-2.5 (3) MG/3ML nebulizer solution 3 mL (has no administration in time range)  ipratropium-albuterol  (DUONEB) 0.5-2.5 (3) MG/3ML nebulizer solution 3 mL (3 mLs Nebulization Given 08/12/23 2050)  iohexol  (OMNIPAQUE ) 350 MG/ML injection 80 mL (80 mLs Intravenous Contrast Given 08/12/23 2305)  cefTRIAXone  (ROCEPHIN ) 1 g in sodium chloride  0.9 % 100 mL IVPB (0 g Intravenous Stopped 08/13/23 0601)  azithromycin  (ZITHROMAX ) 500 mg in sodium chloride  0.9 % 250 mL IVPB (0 mg Intravenous Stopped 08/13/23 0601)  methylPREDNISolone  sodium succinate (SOLU-MEDROL ) 125 mg/2 mL injection 125 mg (125 mg Intravenous Given 08/13/23 0304)  magnesium  sulfate IVPB 2 g 50 mL (0 g Intravenous Stopped 08/13/23 0749)  potassium chloride  (KLOR-CON ) packet 40 mEq (40 mEq Oral Given 08/13/23 1033)    Mobility walks with person assist

## 2023-08-13 NOTE — Progress Notes (Signed)
 Pt arrived to unit via stretcher room 1507. Alert  and oriented x4. Oriented to room and callbell with no complications. Steady gait to bathroom. Pain 0/10. Skin and initial assessment completed. Pt continues on 3L at 98%. Plan of care ongoing.

## 2023-08-13 NOTE — ED Notes (Signed)
Pt ambulated to restroom, steady gait  

## 2023-08-13 NOTE — Evaluation (Signed)
 Physical Therapy Evaluation Patient Details Name: Darlene Sanders MRN: [id number] DOB: August 05, 1946 Today's Date: 08/13/2023  History of Present Illness  77 yo female admitted with acute hypoxic respiratory failure, Pna. Hx of hypothyroidism, anxiety/depression, asthma, R THA 2018, hernia repair  Clinical Impression  On eval, pt was Supv-Mod Ind with mobility. She walked ~150 feet around the unit. O2 88% on RA, dyspnea 2/4 with ambulation. Pt reported fatigue towards end of walk. Placed pt back on 2L [place] O2-sats 92% end of session. Will follow pt during hospital stay. Do not anticipate any f/u PT needs after discharge. Recommend daily ambulation in hallway with nursing and/or mobility team as able.         If plan is discharge home, recommend the following:     Can travel by private vehicle        Equipment Recommendations None recommended by PT  Recommendations for Other Services       Functional Status Assessment Patient has had a recent decline in their functional status and demonstrates the ability to make significant improvements in function in a reasonable and predictable amount of time.     Precautions / Restrictions Precautions Precaution Comments: monitor O2 Restrictions Weight Bearing Restrictions Per Provider Order: No      Mobility  Bed Mobility Overal bed mobility: Modified Independent                  Transfers Overall transfer level: Modified independent                      Ambulation/Gait Ambulation/Gait assistance: Supervision Gait Distance (Feet): 150 Feet Assistive device: None Gait Pattern/deviations: Step-through pattern       General Gait Details: Intermittent mild unsteadiness but no overt LOB. O2 88% on RA, dyspnea. Pt reported fatigue towards end of distance  Stairs            Wheelchair Mobility     Tilt Bed    Modified Rankin (Stroke Patients Only)       Balance Overall balance assessment: Mild deficits  observed, not formally tested                                           Pertinent Vitals/Pain Pain Assessment Pain Assessment: No/denies pain    Home Living Family/patient expects to be discharged to:: Private residence Living Arrangements: Alone Available Help at Discharge: Family Type of Home: House           Home Equipment: Agricultural Consultant (2 wheels);Shower seat      Prior Function Prior Level of Function : Independent/Modified Independent             Mobility Comments: mows her own lawn. active.       Extremity/Trunk Assessment   Upper Extremity Assessment Upper Extremity Assessment: Defer to OT evaluation    Lower Extremity Assessment Lower Extremity Assessment: Overall WFL for tasks assessed    Cervical / Trunk Assessment Cervical / Trunk Assessment: Normal  Communication   Communication Communication: No apparent difficulties  Cognition Arousal: Alert Behavior During Therapy: WFL for tasks assessed/performed Overall Cognitive Status: Within Functional Limits for tasks assessed  General Comments      Exercises     Assessment/Plan    PT Assessment Patient needs continued PT services  PT Problem List Decreased strength;Decreased activity tolerance;Decreased balance;Decreased mobility       PT Treatment Interventions DME instruction;Gait training;Functional mobility training;Therapeutic activities;Therapeutic exercise;Balance training;Patient/family education    PT Goals (Current goals can be found in the Care Plan section)  Acute Rehab PT Goals Patient Stated Goal: regain PLOF PT Goal Formulation: With patient Time For Goal Achievement: 08/27/23 Potential to Achieve Goals: Good    Frequency Min 1X/week     Co-evaluation               AM-PAC PT 6 Clicks Mobility  Outcome Measure Help needed turning from your back to your side while in a flat bed  without using bedrails?: None Help needed moving from lying on your back to sitting on the side of a flat bed without using bedrails?: None Help needed moving to and from a bed to a chair (including a wheelchair)?: None Help needed standing up from a chair using your arms (e.g., wheelchair or bedside chair)?: None Help needed to walk in hospital room?: A Little Help needed climbing 3-5 steps with a railing? : A Little 6 Click Score: 22    End of Session Equipment Utilized During Treatment: Oxygen Activity Tolerance: Patient tolerated treatment well;Patient limited by fatigue Patient left: in bed;with call bell/phone within reach   PT Visit Diagnosis: Difficulty in walking, not elsewhere classified (R26.2)    Time: 8543-8488 PT Time Calculation (min) (ACUTE ONLY): 15 min   Charges:   PT Evaluation $PT Eval Low Complexity: 1 Low   PT General Charges $$ ACUTE PT VISIT: 1 Visit           Dannial SQUIBB, PT Acute Rehabilitation  Office: [phone]

## 2023-08-13 NOTE — H&P (Addendum)
 History and Physical  Darlene Sanders FMW:[id number] DOB: 12-14-1945 DOA: 08/12/2023  Referring physician: Dr. Haze, EDP  PCP: Aisha Harvey, MD  Outpatient Specialists: Pulmonary. Patient coming from: Home through her PCPs office.  Chief Complaint: Shortness of breath and new hypoxia.  HPI: Darlene Sanders is a 77 y.o. female with medical history significant for hypertension, hyperlipidemia, hypothyroidism, chronic anxiety/depression, who presented to the ED due to complaints of shortness of breath for the past 3 to 4 weeks.  Associated with a productive cough and chills.  Initially presented to her primary care provider where she was noted to be hypoxic with O2 saturation in the 80s.  She was referred to the ED for further evaluation.  In the ED, tachypneic and tachycardic with hypoxia worse on exertion with O2 saturation in the 80s on room air.  Currently requiring 4 L to maintain a saturation above 92%.  Not on O2 supplementation at baseline.  CT angio was negative for pulmonary embolism however showed findings suggestive of atypical pneumonia.  Started on Rocephin  and azithromycin .  [facility] was consulted to admit.  ED Course: Temperature 99.1.  BP 124.97, pulse 108, respiration rate 16, saturation 93% on 4 L.  Lab studies notable for WBC 7.8, hemoglobin 14.6, platelet count 165.  Serum sodium 133, glucose 126, GFR greater than 60 BNP 47, lactic acid 0.9.  Review of Systems: Review of systems as noted in the HPI. All other systems reviewed and are negative.   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 She has a 44.3 pack-year smoking history. She has never used smokeless tobacco. She reports current alcohol use. She reports that she does not use drugs.   Allergies  Allergen Reactions   Codeine Nausea Only   Fluorescein Other (See Comments)    Pt says she passed out   Hydromorphone  Nausea Only   Latex    Penicillins Rash    Has patient had a PCN reaction causing immediate rash, facial/tongue/throat swelling, SOB or lightheadedness with hypotension: unknown Has patient had a PCN reaction causing severe rash involving mucus membranes or skin necrosis: Yes Has patient had a PCN reaction that required hospitalization No Has patient had a PCN reaction occurring within the last 10 years: No If all of the above answers are NO, then may proceed with Cephalosporin use.     Family history: None reported.  Prior to Admission medications   Medication Sig Start Date End Date Taking? Authorizing Provider  cetirizine (ZYRTEC) 10 MG tablet Take 10 mg by mouth daily.   Yes [provider]  citalopram  (CELEXA ) 20 MG tablet Take 20 mg by mouth daily.   Yes [provider]  losartan -hydrochlorothiazide  (HYZAAR) 100-12.5 MG tablet Take  1 tablet by mouth daily. 05/31/23  Yes [provider]  mometasone (NASONEX) 50 MCG/ACT nasal spray Place 2 sprays into the nose daily.   Yes [provider]  rosuvastatin  (CRESTOR ) 20 MG tablet Take 20 mg by mouth daily.   Yes [provider]  SYNTHROID  88 MCG tablet Take 88 mcg by mouth daily before breakfast.  04/30/16  Yes [provider]  citalopram  (CELEXA ) 40 MG tablet Take 40 mg by mouth daily. Patient not taking: Reported on  08/13/2023    [provider]  [employer] ASPIRIN  ADULT LOW DOSE 81 MG chewable tablet TAKE 1 TABLET (81 MG TOTAL) BY MOUTH 2 (TWO) TIMES DAILY. Patient not taking: Reported on 08/13/2023 08/25/16   [provider]  cyanocobalamin  2000 MCG tablet Take 4,000 mcg by mouth every other day.    [provider]  docusate sodium  (COLACE) 100 MG capsule Take 1 capsule (100 mg total) by mouth 2 (two) times daily. Patient not taking: Reported on 11/05/2016 08/25/16   Darlene Cordella Hamilton, MD  losartan -hydrochlorothiazide  (HYZAAR) 50-12.5 MG tablet Take 1 tablet by mouth daily. Patient not taking: Reported on 08/13/2023    [provider]  methocarbamol  (ROBAXIN ) 500 MG tablet Take 1 tablet (500 mg total) by mouth every 6 (six) hours as needed for muscle spasms. Patient not taking: Reported on 11/05/2016 08/25/16   Darlene Cordella Hamilton, MD  oxyCODONE  (OXY IR/ROXICODONE ) 5 MG immediate release tablet Take 1-2 tablets (5-10 mg total) by mouth every 3 (three) hours as needed for breakthrough pain. Patient not taking: Reported on 11/05/2016 08/25/16   Darlene Cordella Hamilton, MD    Physical Exam: BP (!) 133/94 (BP Location: Left Arm)   Pulse (!) 101   Temp 99.1 F (37.3 C) (Oral)   Resp 16   Ht 5' 6 (1.676 m)   Wt 62.6 kg   SpO2 93%   BMI 22.27 kg/m   General: 77 y.o. year-old female well developed well nourished in no acute distress.  Alert and oriented x3. Cardiovascular: Regular rate and rhythm with no rubs or gallops.  No thyromegaly or JVD noted.  No lower extremity edema. 2/4 pulses in all 4 extremities. Respiratory: Mild rales at bases with diffuse wheezing noted.  Poor inspiratory effort. Abdomen: Soft nontender nondistended with normal bowel sounds x4 quadrants. Muskuloskeletal: No cyanosis, clubbing or edema noted bilaterally Neuro: CN II-XII intact, strength, sensation, reflexes Skin: No ulcerative lesions noted or rashes Psychiatry: Judgement and insight appear normal.  Mood is appropriate for condition and setting          Labs on Admission:  Basic Metabolic Panel: Recent Labs  Lab 08/12/23 1537  NA 133*  K 3.9  CL 95*  CO2 25  GLUCOSE 126*  BUN 9  CREATININE 0.66  CALCIUM  9.8   Liver Function Tests: No results for input(s): AST, ALT, ALKPHOS, BILITOT, PROT, ALBUMIN in the last 168 hours. No results for input(s): LIPASE, AMYLASE in the last 168 hours. No results for input(s): AMMONIA in the last 168 hours. CBC: Recent Labs  Lab 08/12/23 1537  WBC 7.2  NEUTROABS 5.6  HGB 16.3*  HCT 48.7*  MCV 91.5  PLT 173   Cardiac Enzymes: No results for input(s): CKTOTAL, CKMB, CKMBINDEX, TROPONINI in the last 168 hours.  BNP (last 3 results) Recent Labs    08/12/23 2012  BNP 47.7    ProBNP (last 3 results) No results for input(s): PROBNP in the last 8760 hours.  CBG: No results for  input(s): GLUCAP in the last 168 hours.  Radiological Exams on Admission: CT Angio Chest PE W and/or Wo Contrast Result Date: 08/13/2023 CLINICAL DATA:  Pulmonary embolism (PE) suspected, high prob Pt complains of sob. Having cold sxs for 3 weeks, now with worsening sob and productive cough. No fever, chills, body aches, no n/vd. No hx of PE, no active CA. Seen by PCP today, and sent here due to hypoxia EXAM: CT ANGIOGRAPHY CHEST WITH CONTRAST TECHNIQUE: Multidetector CT imaging of the chest was performed using the standard protocol during bolus administration of intravenous contrast. Multiplanar CT image reconstructions and MIPs were obtained to evaluate the vascular anatomy. RADIATION DOSE REDUCTION: This exam was performed according to the departmental dose-optimization program which includes automated exposure control, adjustment of the mA and/or kV according to patient size and/or use of iterative reconstruction technique. CONTRAST:  80mL OMNIPAQUE  IOHEXOL  350 MG/ML SOLN COMPARISON:  CT chest 06/25/2023 FINDINGS: Cardiovascular:  Satisfactory opacification of the pulmonary arteries to the segmental level. No evidence of pulmonary embolism. Normal heart size. No significant pericardial effusion. The thoracic aorta is normal in caliber. Moderate atherosclerotic plaque of the thoracic aorta. At least 2 vessel coronary artery calcifications. Mediastinum/Nodes: Prominent but nonenlarged mediastinal lymph nodes. No enlarged mediastinal, hilar, or axillary lymph nodes. Thyroid  gland, trachea, and esophagus demonstrate no significant findings. Small hiatal hernia. Lungs/Pleura: Diffuse bronchial wall thickening with developing peribronchovascular tree-in-bud nodularities. No focal consolidation. No pulmonary nodule. No pulmonary mass. No pleural effusion. No pneumothorax. Upper Abdomen: Fluid density lesion likely represents a simple renal cyst. Simple renal cysts, in the absence of clinically indicated signs/symptoms, require no independent follow-up. Subcentimeter density too small to characterize-no further follow-up indicated. Colonic diverticulosis. Musculoskeletal: No chest wall abnormality. No suspicious lytic or blastic osseous lesions. No acute displaced fracture. Multilevel degenerative changes of the spine. Review of the MIP images confirms the above findings. IMPRESSION: 1. No pulmonary embolus. 2. Diffuse bronchial wall thickening with developing peribronchovascular tree-in-bud nodularities. Findings suggestive of developing atypical pneumonia. 3. Small hiatal hernia. 4. Aortic Atherosclerosis (ICD10-I70.0) including coronary artery calcification. Electronically Signed   By: Morgane  Naveau M.D.   On: 08/13/2023 00:53   DG Chest 2 View Result Date: 08/12/2023 CLINICAL DATA:  Shortness of breath. EXAM: CHEST - 2 VIEW COMPARISON:  02/27/2008. FINDINGS: Bilateral lung fields are clear. Bilateral costophrenic angles are clear. Normal cardio-mediastinal silhouette. No acute osseous abnormalities. The soft tissues are within normal  limits. IMPRESSION: No active cardiopulmonary disease. Electronically Signed   By: Ree Molt M.D.   On: 08/12/2023 17:00    EKG: I independently viewed the EKG done and my findings are as followed: Sinus tachycardia rate of 111.  Nonspecific ST-T changes.  QTc 456.  Assessment/Plan Present on Admission:  Acute hypoxic respiratory failure (HCC)  Principal Problem:   Acute hypoxic respiratory failure (HCC)  Acute hypoxic respiratory failure secondary to community-acquired pneumonia, POA Rocephin  and azithromycin  initiated in ED, continue. Received a dose of IV Solu-Medrol , continue Continue bronchodilators DuoNebs every 6 hours Flutter valve Pulmonary toilet Antitussives Sputum culture Early mobilization Home O2 evaluation prior to discharge.  Community-acquired pneumonia, POA Management stated above.  Hypothyroidism Resume home levothyroxine   Hyperlipidemia Resume home Crestor   Chronic anxiety/depression Resume home Celexa .  Electrolytes abnormality with dehydration Hypokalemia Hypomagnesemia Repleted intravenously, NS KCl 20 mill equivalent at 40 cc/h x 1 day IV magnesium  2 g x 1   Time: 75 minutes.   DVT prophylaxis: Subcu Lovenox  daily.  Code Status: Full code.  Family Communication:  None at bedside.  Disposition Plan: Admitted to telemetry unit.  Consults called: None.  Admission status: Inpatient status.   Status is: Inpatient The patient requires at least 2 midnights for further evaluation and treatment of present condition.   Terry LOISE Hurst MD [facility] Pager [phone]  If 7PM-7AM, please contact night-coverage [email or web address] Password [email or web address]  08/13/2023, 3:09 AM

## 2023-08-14 DIAGNOSIS — J9601 Acute respiratory failure with hypoxia: Secondary | ICD-10-CM | POA: Diagnosis not present

## 2023-08-14 LAB — BASIC METABOLIC PANEL
Anion gap: 8 (ref 5–15)
BUN: 15 mg/dL (ref 8–23)
CO2: 25 mmol/L (ref 22–32)
Calcium: 9.2 mg/dL (ref 8.9–10.3)
Chloride: 102 mmol/L (ref 98–111)
Creatinine, Ser: 0.58 mg/dL (ref 0.44–1.00)
GFR, Estimated: >60 mL/min (ref >60)
Glucose, Bld: 113 mg/dL — ABNORMAL HIGH (ref 70–99)
Potassium: 4.4 mmol/L (ref 3.5–5.1)
Sodium: 135 mmol/L (ref 135–145)

## 2023-08-14 LAB — CBC
HCT: 45.6 % (ref 36.0–46.0)
Hemoglobin: 14.6 g/dL (ref 12.0–15.0)
MCH: 30.3 pg (ref 26.0–34.0)
MCHC: 32 g/dL (ref 30.0–36.0)
MCV: 94.6 fL (ref 80.0–100.0)
Platelets: 153 10*3/uL (ref 150–400)
RBC: 4.82 MIL/uL (ref 3.87–5.11)
RDW: 13.2 % (ref 11.5–15.5)
WBC: 11.7 10*3/uL — ABNORMAL HIGH (ref 4.0–10.5)
nRBC: 0 % (ref 0.0–0.2)

## 2023-08-14 LAB — MAGNESIUM: Magnesium: 2.1 mg/dL (ref 1.7–2.4)

## 2023-08-14 LAB — PHOSPHORUS: Phosphorus: 2.5 mg/dL (ref 2.5–4.6)

## 2023-08-14 MED ORDER — IPRATROPIUM-ALBUTEROL 0.5-2.5 (3) MG/3ML IN SOLN
3.0000 mL | Freq: Two times a day (BID) | RESPIRATORY_TRACT | Status: DC
  Administered 2023-08-14 – 2023-08-15 (×2): 3 mL via RESPIRATORY_TRACT
  Filled 2023-08-14 (×2): qty 3

## 2023-08-14 NOTE — Progress Notes (Signed)
 PROGRESS NOTE    Darlene Sanders  FMW:[id number] DOB: 01-10-1946 DOA: 08/12/2023 PCP: Aisha Harvey, MD   Brief Narrative: This 78 years old female with PMH significant for hypertension, hyperlipidemia, hypothyroidism, chronic anxiety/depression presented to the ED with complaints of shortness of breath for last 3 to 4 days associated with productive cough and chills. Patient initially presented to her PCP where she was noted to be hypoxic with SpO2 of 80% on room air.  Patient was placed on supplemental oxygen and was sent in the ED.  She does not use oxygen at baseline.  CT angio was negative for pulmonary embolism however it showed findings suggestive of atypical pneumonia.  Patient admitted for further evaluation and started on IV Rocephin  and Zithromax .   Assessment & Plan:   Principal Problem:   Acute hypoxic respiratory failure (HCC)   Acute hypoxic respiratory failure Community-acquired pneumonia: POA: Patient presented with productive cough, chills, shortness of breath for last 3 to 4 days. CT chest ruled out pulmonary embolism but did show findings suggestive of atypical pneumonia. Continue IV Rocephin  and Zithromax . Continue IV Solu-Medrol  40 mg  daily Continue nebulized bronchodilators every 6 hrs. Flutter valve,  Pulmonary toilet Continue Antitussives Sputum culture Early mobilization Check Home O2 evaluation prior to discharge.   Community-acquired pneumonia, POA Continue antibiotics.   Hypothyroidism: Continue levothyroxine .   Hyperlipidemia: Continue Crestor .   Chronic anxiety/depression: Continue Celexa .   Hypokalemia/hypomagnesemia Replaced. Continue to monitor  DVT prophylaxis: Lovenox . Code Status: Full code Family Communication: No family at bed side. Disposition Plan:    Status is: Inpatient Remains inpatient appropriate because:  Patient admitted for acute hypoxic respiratory failure, found to have pneumonia , started on IV antibiotics.     Consultants:   None  Procedures:None  Antimicrobials:  Anti-infectives (From admission, onward)    Start     Dose/Rate Route Frequency Ordered Stop   08/14/23 0400  cefTRIAXone  (ROCEPHIN ) 1 g in sodium chloride  0.9 % 100 mL IVPB        1 g 200 mL/hr over 30 Minutes Intravenous Every 24 hours 08/13/23 0308 08/18/23 0359   08/14/23 0300  azithromycin  (ZITHROMAX ) 500 mg in sodium chloride  0.9 % 250 mL IVPB        500 mg 250 mL/hr over 60 Minutes Intravenous Every 24 hours 08/13/23 0308 08/18/23 0259   08/13/23 0215  cefTRIAXone  (ROCEPHIN ) 1 g in sodium chloride  0.9 % 100 mL IVPB        1 g 200 mL/hr over 30 Minutes Intravenous  Once 08/13/23 0201 08/13/23 0601   08/13/23 0215  azithromycin  (ZITHROMAX ) 500 mg in sodium chloride  0.9 % 250 mL IVPB        500 mg 250 mL/hr over 60 Minutes Intravenous  Once 08/13/23 0201 08/13/23 0601      Subjective: Patient was seen and examined at bedside.  Overnight events noted.   Patient reports doing much better,  still reports feeling tight and shortness of breath.  Objective: Vitals:   08/14/23 0207 08/14/23 0657 08/14/23 0803 08/14/23 1120  BP: (!) 143/89 (!) 149/85  125/70  Pulse: 94 91  89  Resp: 20 20    Temp: 98.7 F (37.1 C) 98 F (36.7 C)  97.9 F (36.6 C)  TempSrc:    Oral  SpO2: 98% 98% 96%   Weight:      Height:        Intake/Output Summary (Last 24 hours) at 08/14/2023 1216 Last data filed at 08/14/2023 0523 Gross per  24 hour  Intake 852.62 ml  Output --  Net 852.62 ml   Filed Weights   08/12/23 1525  Weight: 62.6 kg    Examination:  General exam: Appears calm and comfortable , not in any acute distress. Respiratory system: Clear to auscultation. Respiratory effort normal.  RR 16 Cardiovascular system: S1 & S2 heard, RRR. No JVD, murmurs, rubs, gallops or clicks. No pedal edema. Gastrointestinal system: Abdomen is non distended, soft and non tender.  Normal bowel sounds heard. Central nervous system: Alert and  oriented x 3. No focal neurological deficits. Extremities: No edema, no cyanosis, no clubbing. Skin: No rashes, lesions or ulcers Psychiatry: Judgement and insight appear normal. Mood & affect appropriate.     Data Reviewed: I have personally reviewed following labs and imaging studies  CBC: Recent Labs  Lab 08/12/23 1537 08/13/23 0307 08/14/23 0552  WBC 7.2 7.8 11.7*  NEUTROABS 5.6  --   --   HGB 16.3* 14.6 14.6  HCT 48.7* 43.9 45.6  MCV 91.5 91.5 94.6  PLT 173 165 153   Basic Metabolic Panel: Recent Labs  Lab 08/12/23 1537 08/13/23 0307 08/14/23 0552  NA 133* 129* 135  K 3.9 3.2* 4.4  CL 95* 98 102  CO2 25 22 25   GLUCOSE 126* 112* 113*  BUN 9 9 15   CREATININE 0.66 0.61 0.58  CALCIUM  9.8 8.6* 9.2  MG  --  1.5* 2.1  PHOS  --  3.2 2.5   GFR: Estimated Creatinine Clearance: 55.1 mL/min (by C-G formula based on SCr of 0.58 mg/dL). Liver Function Tests: No results for input(s): AST, ALT, ALKPHOS, BILITOT, PROT, ALBUMIN in the last 168 hours. No results for input(s): LIPASE, AMYLASE in the last 168 hours. No results for input(s): AMMONIA in the last 168 hours. Coagulation Profile: No results for input(s): INR, PROTIME in the last 168 hours. Cardiac Enzymes: No results for input(s): CKTOTAL, CKMB, CKMBINDEX, TROPONINI in the last 168 hours. BNP (last 3 results) No results for input(s): PROBNP in the last 8760 hours. HbA1C: No results for input(s): HGBA1C in the last 72 hours. CBG: No results for input(s): GLUCAP in the last 168 hours. Lipid Profile: No results for input(s): CHOL, HDL, LDLCALC, TRIG, CHOLHDL, LDLDIRECT in the last 72 hours. Thyroid  Function Tests: No results for input(s): TSH, T4TOTAL, FREET4, T3FREE, THYROIDAB in the last 72 hours. Anemia Panel: No results for input(s): VITAMINB12, FOLATE, FERRITIN, TIBC, IRON, RETICCTPCT in the last 72 hours. Sepsis Labs: Recent Labs  Lab  08/13/23 0318 08/13/23 0517  LATICACIDVEN 0.9 0.6    Recent Results (from the past 240 hours)  Resp panel by RT-PCR (RSV, Flu A&B, Covid) Anterior Nasal Swab     Status: None   Collection Time: 08/12/23  8:12 PM   Specimen: Anterior Nasal Swab  Result Value Ref Range Status   SARS Coronavirus 2 by RT PCR NEGATIVE NEGATIVE Final    Comment: (NOTE) SARS-CoV-2 target nucleic acids are NOT DETECTED.  The SARS-CoV-2 RNA is generally detectable in upper respiratory specimens during the acute phase of infection. The lowest concentration of SARS-CoV-2 viral copies this assay can detect is 138 copies/mL. A negative result does not preclude SARS-Cov-2 infection and should not be used as the sole basis for treatment or other patient management decisions. A negative result may occur with  improper specimen collection/handling, submission of specimen other than nasopharyngeal swab, presence of viral mutation(s) within the areas targeted by this assay, and inadequate number of viral copies(<138 copies/mL). A negative  result must be combined with clinical observations, patient history, and epidemiological information. The expected result is Negative.  Fact Sheet for Patients:  bloggercourse.com  Fact Sheet for Healthcare Providers:  seriousbroker.it  This test is no t yet approved or cleared by the [place]  FDA and  has been authorized for detection and/or diagnosis of SARS-CoV-2 by FDA under an Emergency Use Authorization (EUA). This EUA will remain  in effect (meaning this test can be used) for the duration of the COVID-19 declaration under Section 564(b)(1) of the Act, 21 U.S.C.section 360bbb-3(b)(1), unless the authorization is terminated  or revoked sooner.       Influenza A by PCR NEGATIVE NEGATIVE Final   Influenza B by PCR NEGATIVE NEGATIVE Final    Comment: (NOTE) The Xpert Xpress SARS-CoV-2/FLU/RSV plus assay is intended  as an aid in the diagnosis of influenza from Nasopharyngeal swab specimens and should not be used as a sole basis for treatment. Nasal washings and aspirates are unacceptable for Xpert Xpress SARS-CoV-2/FLU/RSV testing.  Fact Sheet for Patients: bloggercourse.com  Fact Sheet for Healthcare Providers: seriousbroker.it  This test is not yet approved or cleared by the [place]  FDA and has been authorized for detection and/or diagnosis of SARS-CoV-2 by FDA under an Emergency Use Authorization (EUA). This EUA will remain in effect (meaning this test can be used) for the duration of the COVID-19 declaration under Section 564(b)(1) of the Act, 21 U.S.C. section 360bbb-3(b)(1), unless the authorization is terminated or revoked.     Resp Syncytial Virus by PCR NEGATIVE NEGATIVE Final    Comment: (NOTE) Fact Sheet for Patients: bloggercourse.com  Fact Sheet for Healthcare Providers: seriousbroker.it  This test is not yet approved or cleared by the [place]  FDA and has been authorized for detection and/or diagnosis of SARS-CoV-2 by FDA under an Emergency Use Authorization (EUA). This EUA will remain in effect (meaning this test can be used) for the duration of the COVID-19 declaration under Section 564(b)(1) of the Act, 21 U.S.C. section 360bbb-3(b)(1), unless the authorization is terminated or revoked.  Performed at [facility], [address], [place], [place] [address]   Culture, blood (Routine X 2) w Reflex to ID Panel     Status: None (Preliminary result)   Collection Time: 08/13/23  2:55 AM   Specimen: BLOOD LEFT ARM  Result Value Ref Range Status   Specimen Description   Final    BLOOD LEFT ARM Performed at [facility], [address] [place], [place] [address]    Special Requests   Final    BOTTLES DRAWN AEROBIC AND  ANAEROBIC Blood Culture results may not be optimal due to an inadequate volume of blood received in culture bottles Performed at [facility], [address] [place], [place] [address]    Culture   Final    NO GROWTH 1 DAY Performed at [facility], [address], [place], [place] [address]    Report Status PENDING  Incomplete  Culture, blood (Routine X 2) w Reflex to ID Panel     Status: None (Preliminary result)   Collection Time: 08/13/23  3:11 AM   Specimen: BLOOD RIGHT FOREARM  Result Value Ref Range Status   Specimen Description   Final    BLOOD RIGHT FOREARM Performed at [facility], [address], [place], [place] [address]    Special Requests   Final    BOTTLES DRAWN AEROBIC AND ANAEROBIC Blood Culture results may  not be optimal due to an inadequate volume of blood received in culture bottles Performed at [facility], [address] [place], [place] [address]    Culture   Final    NO GROWTH 1 DAY Performed at [facility], [address] [place], [place] [address]    Report Status PENDING  Incomplete  Expectorated Sputum Assessment w Gram Stain, Rflx to Resp Cult     Status: None   Collection Time: 08/13/23 11:33 AM   Specimen: Sputum  Result Value Ref Range Status   Specimen Description SPUTUM  Final   Special Requests Normal  Final   Sputum evaluation   Final    THIS SPECIMEN IS ACCEPTABLE FOR SPUTUM CULTURE Performed at [facility], [address], [place], [place] [address]    Report Status 08/13/2023 FINAL  Final  Culture, Respiratory w Gram Stain     Status: None (Preliminary result)   Collection Time: 08/13/23 11:33 AM   Specimen: SPU  Result Value Ref Range Status   Specimen Description   Final    SPUTUM Performed at [facility], [address], [place], [place] [address]    Special Requests   Final    Normal Reflexed from [id number] Performed  at [facility], [address], [place], [place] [address]    Gram Stain   Final    ABUNDANT WBC PRESENT, PREDOMINANTLY PMN FEW GRAM POSITIVE COCCI    Culture   Final    CULTURE REINCUBATED FOR BETTER GROWTH Performed at [facility], [address], [place], [place] [address]    Report Status PENDING  Incomplete    Radiology Studies: CT Angio Chest PE W and/or Wo Contrast Result Date: 08/13/2023 CLINICAL DATA:  Pulmonary embolism (PE) suspected, high prob Pt complains of sob. Having cold sxs for 3 weeks, now with worsening sob and productive cough. No fever, chills, body aches, no n/vd. No hx of PE, no active CA. Seen by PCP today, and sent here due to hypoxia EXAM: CT ANGIOGRAPHY CHEST WITH CONTRAST TECHNIQUE: Multidetector CT imaging of the chest was performed using the standard protocol during bolus administration of intravenous contrast. Multiplanar CT image reconstructions and MIPs were obtained to evaluate the vascular anatomy. RADIATION DOSE REDUCTION: This exam was performed according to the departmental dose-optimization program which includes automated exposure control, adjustment of the mA and/or kV according to patient size and/or use of iterative reconstruction technique. CONTRAST:  80mL OMNIPAQUE  IOHEXOL  350 MG/ML SOLN COMPARISON:  CT chest 06/25/2023 FINDINGS: Cardiovascular: Satisfactory opacification of the pulmonary arteries to the segmental level. No evidence of pulmonary embolism. Normal heart size. No significant pericardial effusion. The thoracic aorta is normal in caliber. Moderate atherosclerotic plaque of the thoracic aorta. At least 2 vessel coronary artery calcifications. Mediastinum/Nodes: Prominent but nonenlarged mediastinal lymph nodes. No enlarged mediastinal, hilar, or axillary lymph nodes. Thyroid  gland, trachea, and esophagus demonstrate no significant findings. Small hiatal hernia. Lungs/Pleura: Diffuse bronchial wall thickening with  developing peribronchovascular tree-in-bud nodularities. No focal consolidation. No pulmonary nodule. No pulmonary mass. No pleural effusion. No pneumothorax. Upper Abdomen: Fluid density lesion likely represents a simple renal cyst. Simple renal cysts, in the absence of clinically indicated signs/symptoms, require no independent follow-up. Subcentimeter density too small to characterize-no further follow-up indicated. Colonic diverticulosis. Musculoskeletal: No chest wall abnormality. No suspicious lytic or blastic osseous lesions. No acute displaced fracture. Multilevel degenerative changes of the spine. Review of the MIP images  confirms the above findings. IMPRESSION: 1. No pulmonary embolus. 2. Diffuse bronchial wall thickening with developing peribronchovascular tree-in-bud nodularities. Findings suggestive of developing atypical pneumonia. 3. Small hiatal hernia. 4. Aortic Atherosclerosis (ICD10-I70.0) including coronary artery calcification. Electronically Signed   By: Morgane  Naveau M.D.   On: 08/13/2023 00:53   DG Chest 2 View Result Date: 08/12/2023 CLINICAL DATA:  Shortness of breath. EXAM: CHEST - 2 VIEW COMPARISON:  02/27/2008. FINDINGS: Bilateral lung fields are clear. Bilateral costophrenic angles are clear. Normal cardio-mediastinal silhouette. No acute osseous abnormalities. The soft tissues are within normal limits. IMPRESSION: No active cardiopulmonary disease. Electronically Signed   By: Ree Molt M.D.   On: 08/12/2023 17:00    Scheduled Meds:  citalopram   20 mg Oral Daily   cyanocobalamin   4,000 mcg Oral QODAY   enoxaparin  (LOVENOX ) injection  40 mg Subcutaneous Q24H   guaiFENesin   600 mg Oral BID   ipratropium-albuterol   3 mL Nebulization BID   levothyroxine   88 mcg Oral QAC breakfast   methylPREDNISolone  (SOLU-MEDROL ) injection  40 mg Intravenous Daily   rosuvastatin   20 mg Oral Daily   sodium chloride  HYPERTONIC  4 mL Nebulization BID   Continuous Infusions:   azithromycin  (ZITHROMAX ) 500 mg in sodium chloride  0.9 % 250 mL IVPB Stopped (08/14/23 0435)   cefTRIAXone  (ROCEPHIN )  IV 200 mL/hr at 08/14/23 0523     LOS: 1 day    Time spent: 50 mins    Darcel Dawley, MD [facility]   If 7PM-7AM, please contact night-coverage

## 2023-08-14 NOTE — Progress Notes (Signed)
 SATURATION QUALIFICATIONS: (This note is used to comply with regulatory documentation for home oxygen)  Patient Saturations on Room Air at Rest = 94%  Patient Saturations on Room Air while Ambulating = 91%   Patient tolerated ambulating multiple lengths of the hallway on room air without desatting or requiring supplemental oxygen.

## 2023-08-14 NOTE — Plan of Care (Signed)
 No acute events this shift. The patient has rested comfortably throughout the shift. She has had no complaints of pain. She was weaned off of the oxygen, and has maintained her oxygen saturation within normal limits both at rest and during frequent ambulation in the hallway. VSS. Fall precautions in place. Will continue to monitor.  Problem: Education: Goal: Knowledge of General Education information will improve Description: Including pain rating scale, medication(s)/side effects and non-pharmacologic comfort measures Outcome: Progressing   Problem: Health Behavior/Discharge Planning: Goal: Ability to manage health-related needs will improve Outcome: Progressing   Problem: Clinical Measurements: Goal: Ability to maintain clinical measurements within normal limits will improve Outcome: Progressing Goal: Will remain free from infection Outcome: Progressing Goal: Diagnostic test results will improve Outcome: Progressing Goal: Respiratory complications will improve Outcome: Progressing Goal: Cardiovascular complication will be avoided Outcome: Progressing   Problem: Activity: Goal: Risk for activity intolerance will decrease Outcome: Progressing   Problem: Nutrition: Goal: Adequate nutrition will be maintained Outcome: Progressing   Problem: Coping: Goal: Level of anxiety will decrease Outcome: Progressing   Problem: Elimination: Goal: Will not experience complications related to bowel motility Outcome: Progressing Goal: Will not experience complications related to urinary retention Outcome: Progressing   Problem: Pain Management: Goal: General experience of comfort will improve Outcome: Progressing   Problem: Safety: Goal: Ability to remain free from injury will improve Outcome: Progressing   Problem: Skin Integrity: Goal: Risk for impaired skin integrity will decrease Outcome: Progressing

## 2023-08-14 NOTE — Evaluation (Addendum)
 Occupational Therapy Evaluation Patient Details Name: Darlene Sanders MRN: [id number] DOB: 1946/06/07 Today's Date: 08/14/2023   History of Present Illness 78 yr old female admitted with acute hypoxic respiratory failure, PNA. Hx of hypothyroidism, anxiety/depression, asthma, R THA 2018, facial melanoma, OA   Clinical Impression   The pt completed all assessed tasks with distant supervision or better, including lower body dressing, sit to stand, toileting at bathroom level, and grooming in standing at sink level. Her O2 saturation was noted to decrease to 87% on room air with activity and her heart rate increased to 112 bpm. She was instructed on implementing deep breathing exercises and therapeutic rest breaks as needed, with her O2 saturation improving to >90% after implementation. All needed OT education/intervention was provided during the session & she does not require future OT services. OT will sign off and recommend she return home at discharge.        If plan is discharge home, recommend the following: Other (comment) (N/A)    Functional Status Assessment  Patient has not had a recent decline in their functional status  Equipment Recommendations  None recommended by OT    Recommendations for Other Services       Precautions / Restrictions Precautions Precaution Comments: monitor O2      Mobility Bed Mobility Overal bed mobility: Independent     Transfers Overall transfer level: Independent Equipment used: None Transfers: Sit to/from Stand Sit to Stand: Independent                  Balance Overall balance assessment: No apparent balance deficits (not formally assessed)           ADL either performed or assessed with clinical judgement   ADL Overall ADL's : At baseline        General ADL Comments: The pt performed all assessed sefl-care tasks with distant supervision or better, including lower body dressing, toileting at bathroom level, and grooming in  standing at sink level.     Vision   Additional Comments: She correctly read the time depicted on the wall clock.            Pertinent Vitals/Pain Pain Assessment Pain Assessment: No/denies pain     Extremity/Trunk Assessment Upper Extremity Assessment Upper Extremity Assessment: Overall WFL for tasks assessed;Right hand dominant   Lower Extremity Assessment Lower Extremity Assessment: Overall WFL for tasks assessed       Communication Communication Communication: No apparent difficulties   Cognition Arousal: Alert Behavior During Therapy: WFL for tasks assessed/performed Overall Cognitive Status: Within Functional Limits for tasks assessed        General Comments: Oriented x4, able to follow commands without difficulty                Home Living Family/patient expects to be discharged to:: Private residence Living Arrangements: Alone   Type of Home: House Home Access: Stairs to enter Secretary/administrator of Steps: 3   Home Layout: Two level;1/2 bath on main level Alternate Level Stairs-Number of Steps: bedroom and full bathroom upstairs   Bathroom Shower/Tub: Walk-in shower         Home Equipment: Shower seat - built in          Prior Functioning/Environment Prior Level of Function : Independent/Modified Independent             Mobility Comments:Independent with ambulation. ADLs Comments: mows her own lawn. active. drives        OT Problem List: Cardiopulmonary status  limiting activity      OT Treatment/Interventions:   N/A   OT Goals(Current goals can be found in the care plan section) Acute Rehab OT Goals OT Goal Formulation: All assessment and education complete, DC therapy  OT Frequency:         AM-PAC OT 6 Clicks Daily Activity     Outcome Measure Help from another person eating meals?: None Help from another person taking care of personal grooming?: None Help from another person toileting, which includes using toliet,  bedpan, or urinal?: None Help from another person bathing (including washing, rinsing, drying)?: None Help from another person to put on and taking off regular upper body clothing?: None Help from another person to put on and taking off regular lower body clothing?: None 6 Click Score: 24   End of Session Equipment Utilized During Treatment: Oxygen Nurse Communication: Other (comment)  Activity Tolerance: Patient tolerated treatment well Patient left: in chair;with call bell/phone within reach  OT Visit Diagnosis: Muscle weakness (generalized) (M62.81)                Time: 8969-8947 OT Time Calculation (min): 22 min Charges:  OT General Charges $OT Visit: 1 Visit OT Evaluation $OT Eval Low Complexity: 1 Low    Ayaana Biondo L Rehaan Viloria, OTR/L 08/14/2023, 12:14 PM

## 2023-08-15 DIAGNOSIS — J9601 Acute respiratory failure with hypoxia: Secondary | ICD-10-CM | POA: Diagnosis not present

## 2023-08-15 LAB — CULTURE, RESPIRATORY W GRAM STAIN: Special Requests: NORMAL

## 2023-08-15 LAB — CG4 I-STAT (LACTIC ACID): Lactic Acid, Venous: 0.8 mmol/L (ref 0.5–1.9)

## 2023-08-15 MED ORDER — GUAIFENESIN ER 600 MG PO TB12: 600.0000 mg | ORAL_TABLET | Freq: Two times a day (BID) | ORAL | 0 refills | Status: AC

## 2023-08-15 MED ORDER — PREDNISONE 20 MG PO TABS: 40.0000 mg | ORAL_TABLET | Freq: Every day | ORAL | 0 refills | Status: AC

## 2023-08-15 MED ORDER — CEFDINIR 300 MG PO CAPS: 300.0000 mg | ORAL_CAPSULE | Freq: Two times a day (BID) | ORAL | 0 refills | Status: AC

## 2023-08-15 NOTE — Discharge Summary (Signed)
 Physician Discharge Summary  PARALEE PENDERGRASS FMW:[id number] DOB: 1946-04-19 DOA: 08/12/2023  PCP: Aisha Harvey, MD  Admit date: 08/12/2023  Discharge date: 08/15/2023  Admitted From: Home  Disposition:  Home  Recommendations for Outpatient Follow-up:  Follow up with PCP in 1-2 weeks. Please obtain BMP/CBC in one week. Advised to take Omnicef  300 mg p.o. twice daily for 3 days to complete 5-day treatment. Advised to take prednisone  40 mg daily for 3 days.  Home Health:None Equipment/Devices:None  Discharge Condition: Stable CODE STATUS:Full code Diet recommendation: Heart Healthy   Brief [facility] Course: This 78 years old female with PMH significant for hypertension, hyperlipidemia, hypothyroidism, chronic anxiety/depression presented to the ED with complaints of shortness of breath for last 3 to 4 days associated with productive cough and chills. Patient initially presented to her PCP where she was noted to be hypoxic with SpO2 of 80% on room air. Patient was placed on supplemental oxygen and was sent in the ED. She does not use oxygen at baseline. CT angio was negative for pulmonary embolism however it showed findings suggestive of atypical pneumonia. Patient was admitted for further evaluation and started on IV Rocephin  and Zithromax .  Patient has made significant improvement with antibiotics and prednisone . She has successfully weaned down to room air. Patient felt better and wants to be discharged.  Patient is being discharged home on Omnicef  300 mg twice daily and prednisone  40 mg daily for 3 days.  Discharge Diagnoses:  Principal Problem:   Acute hypoxic respiratory failure (HCC)  Acute hypoxic respiratory failure Community-acquired pneumonia: POA: Patient presented with productive cough, chills, shortness of breath for last 3 to 4 days. CT chest ruled out pulmonary embolism but did show findings suggestive of atypical pneumonia. Continue IV Rocephin  and  Zithromax . Continue IV Solu-Medrol  40 mg  daily Continue nebulized bronchodilators every 6 hrs. Flutter valve,  Pulmonary toilet Continue Antitussives Sputum culture Early mobilization Safely weaned down to room air. Patient being discharged on Omnicef  and prednisone  for 3 days.   Community-acquired pneumonia, POA Discharged on Omnicef  and prednisone  for 3 days.   Hypothyroidism: Continue levothyroxine .   Hyperlipidemia: Continue Crestor .   Chronic anxiety/depression: Continue Celexa .   Hypokalemia/hypomagnesemia Replaced. Continue to monitor  Discharge Instructions  Discharge Instructions     Call MD for:  difficulty breathing, headache or visual disturbances   Complete by: As directed    Call MD for:  persistant dizziness or light-headedness   Complete by: As directed    Call MD for:  persistant nausea and vomiting   Complete by: As directed    Diet - low sodium heart healthy   Complete by: As directed    Diet Carb Modified   Complete by: As directed    Discharge instructions   Complete by: As directed    Advised to follow-up with primary care physician in 1 week. Advised to take Omnicef  300 mg twice daily for 3 more days to complete 5 days. Advised to take prednisone  40 mg daily for 3 more days.   Increase activity slowly   Complete by: As directed       Allergies as of 08/15/2023       Reactions   Codeine Nausea Only   Fluorescein Other (See Comments)   Pt says she passed out   Hydromorphone  Nausea Only   Latex    Penicillins Rash   Has patient had a PCN reaction causing immediate rash, facial/tongue/throat swelling, SOB or lightheadedness with hypotension: unknown Has patient had a  PCN reaction causing severe rash involving mucus membranes or skin necrosis: Yes Has patient had a PCN reaction that required hospitalization No Has patient had a PCN reaction occurring within the last 10 years: No If all of the above answers are NO, then may proceed  with Cephalosporin use.        Medication List     STOP taking these medications    [facility] Aspirin  Adult Low Dose 81 MG chewable tablet Generic drug: aspirin    docusate sodium  100 MG capsule Commonly known as: COLACE   methocarbamol  500 MG tablet Commonly known as: ROBAXIN    oxyCODONE  5 MG immediate release tablet Commonly known as: Oxy IR/ROXICODONE        TAKE these medications    cefdinir  300 MG capsule Commonly known as: OMNICEF  Take 1 capsule (300 mg total) by mouth 2 (two) times daily for 3 days.   cetirizine 10 MG tablet Commonly known as: ZYRTEC Take 10 mg by mouth daily.   citalopram  20 MG tablet Commonly known as: CELEXA  Take 20 mg by mouth daily. What changed: Another medication with the same name was removed. Continue taking this medication, and follow the directions you see here.   cyanocobalamin  2000 MCG tablet Take 4,000 mcg by mouth every other day.   guaiFENesin  600 MG 12 hr tablet Commonly known as: MUCINEX  Take 1 tablet (600 mg total) by mouth 2 (two) times daily for 10 days.   losartan -hydrochlorothiazide  100-12.5 MG tablet Commonly known as: HYZAAR Take 1 tablet by mouth daily. What changed: Another medication with the same name was removed. Continue taking this medication, and follow the directions you see here.   mometasone 50 MCG/ACT nasal spray Commonly known as: NASONEX Place 2 sprays into the nose daily.   predniSONE  20 MG tablet Commonly known as: DELTASONE  Take 2 tablets (40 mg total) by mouth daily with breakfast for 3 days.   rosuvastatin  20 MG tablet Commonly known as: CRESTOR  Take 20 mg by mouth daily.   Synthroid  88 MCG tablet Generic drug: levothyroxine  Take 88 mcg by mouth daily before breakfast.        Follow-up Information     Aisha Harvey, MD Follow up in 1 week(s).   Specialty: Family Medicine Contact information: [address] [place] [address] [phone]                Allergies   Allergen Reactions   Codeine Nausea Only   Fluorescein Other (See Comments)    Pt says she passed out   Hydromorphone  Nausea Only   Latex    Penicillins Rash    Has patient had a PCN reaction causing immediate rash, facial/tongue/throat swelling, SOB or lightheadedness with hypotension: unknown Has patient had a PCN reaction causing severe rash involving mucus membranes or skin necrosis: Yes Has patient had a PCN reaction that required hospitalization No Has patient had a PCN reaction occurring within the last 10 years: No If all of the above answers are NO, then may proceed with Cephalosporin use.     Consultations: None   Procedures/Studies: CT Angio Chest PE W and/or Wo Contrast Result Date: 08/13/2023 CLINICAL DATA:  Pulmonary embolism (PE) suspected, high prob Pt complains of sob. Having cold sxs for 3 weeks, now with worsening sob and productive cough. No fever, chills, body aches, no n/vd. No hx of PE, no active CA. Seen by PCP today, and sent here due to hypoxia EXAM: CT ANGIOGRAPHY CHEST WITH CONTRAST TECHNIQUE: Multidetector CT imaging of the  chest was performed using the standard protocol during bolus administration of intravenous contrast. Multiplanar CT image reconstructions and MIPs were obtained to evaluate the vascular anatomy. RADIATION DOSE REDUCTION: This exam was performed according to the departmental dose-optimization program which includes automated exposure control, adjustment of the mA and/or kV according to patient size and/or use of iterative reconstruction technique. CONTRAST:  80mL OMNIPAQUE  IOHEXOL  350 MG/ML SOLN COMPARISON:  CT chest 06/25/2023 FINDINGS: Cardiovascular: Satisfactory opacification of the pulmonary arteries to the segmental level. No evidence of pulmonary embolism. Normal heart size. No significant pericardial effusion. The thoracic aorta is normal in caliber. Moderate atherosclerotic plaque of the thoracic aorta. At least 2 vessel coronary  artery calcifications. Mediastinum/Nodes: Prominent but nonenlarged mediastinal lymph nodes. No enlarged mediastinal, hilar, or axillary lymph nodes. Thyroid  gland, trachea, and esophagus demonstrate no significant findings. Small hiatal hernia. Lungs/Pleura: Diffuse bronchial wall thickening with developing peribronchovascular tree-in-bud nodularities. No focal consolidation. No pulmonary nodule. No pulmonary mass. No pleural effusion. No pneumothorax. Upper Abdomen: Fluid density lesion likely represents a simple renal cyst. Simple renal cysts, in the absence of clinically indicated signs/symptoms, require no independent follow-up. Subcentimeter density too small to characterize-no further follow-up indicated. Colonic diverticulosis. Musculoskeletal: No chest wall abnormality. No suspicious lytic or blastic osseous lesions. No acute displaced fracture. Multilevel degenerative changes of the spine. Review of the MIP images confirms the above findings. IMPRESSION: 1. No pulmonary embolus. 2. Diffuse bronchial wall thickening with developing peribronchovascular tree-in-bud nodularities. Findings suggestive of developing atypical pneumonia. 3. Small hiatal hernia. 4. Aortic Atherosclerosis (ICD10-I70.0) including coronary artery calcification. Electronically Signed   By: Morgane  Naveau M.D.   On: 08/13/2023 00:53   DG Chest 2 View Result Date: 08/12/2023 CLINICAL DATA:  Shortness of breath. EXAM: CHEST - 2 VIEW COMPARISON:  02/27/2008. FINDINGS: Bilateral lung fields are clear. Bilateral costophrenic angles are clear. Normal cardio-mediastinal silhouette. No acute osseous abnormalities. The soft tissues are within normal limits. IMPRESSION: No active cardiopulmonary disease. Electronically Signed   By: Ree Molt M.D.   On: 08/12/2023 17:00     Subjective: Patient was seen and examined at bedside.  Overnight events noted.   Patient reports doing much better,  successfully weaned down to room air.  She  wants to be discharged.  Discharge Exam: Vitals:   08/15/23 0444 08/15/23 0822  BP: (!) 155/90   Pulse: 95   Resp: 18   Temp: 98 F (36.7 C)   SpO2: 94% 95%   Vitals:   08/14/23 1941 08/14/23 2012 08/15/23 0444 08/15/23 0822  BP: (!) 160/86  (!) 155/90   Pulse: 95  95   Resp: 18  18   Temp: 97.7 F (36.5 C)  98 F (36.7 C)   TempSrc: Oral  Oral   SpO2: 92% 94% 94% 95%  Weight:      Height:        General: Pt is alert, awake, not in acute distress Cardiovascular: RRR, S1/S2 +, no rubs, no gallops Respiratory: CTA bilaterally, no wheezing, no rhonchi Abdominal: Soft, NT, ND, bowel sounds + Extremities: no edema, no cyanosis    The results of significant diagnostics from this hospitalization (including imaging, microbiology, ancillary and laboratory) are listed below for reference.     Microbiology: Recent Results (from the past 240 hours)  Resp panel by RT-PCR (RSV, Flu A&B, Covid) Anterior Nasal Swab     Status: None   Collection Time: 08/12/23  8:12 PM   Specimen: Anterior Nasal Swab  Result Value Ref  Range Status   SARS Coronavirus 2 by RT PCR NEGATIVE NEGATIVE Final    Comment: (NOTE) SARS-CoV-2 target nucleic acids are NOT DETECTED.  The SARS-CoV-2 RNA is generally detectable in upper respiratory specimens during the acute phase of infection. The lowest concentration of SARS-CoV-2 viral copies this assay can detect is 138 copies/mL. A negative result does not preclude SARS-Cov-2 infection and should not be used as the sole basis for treatment or other patient management decisions. A negative result may occur with  improper specimen collection/handling, submission of specimen other than nasopharyngeal swab, presence of viral mutation(s) within the areas targeted by this assay, and inadequate number of viral copies(<138 copies/mL). A negative result must be combined with clinical observations, patient history, and epidemiological information. The expected  result is Negative.  Fact Sheet for Patients:  bloggercourse.com  Fact Sheet for Healthcare Providers:  seriousbroker.it  This test is no t yet approved or cleared by the [place]  FDA and  has been authorized for detection and/or diagnosis of SARS-CoV-2 by FDA under an Emergency Use Authorization (EUA). This EUA will remain  in effect (meaning this test can be used) for the duration of the COVID-19 declaration under Section 564(b)(1) of the Act, 21 U.S.C.section 360bbb-3(b)(1), unless the authorization is terminated  or revoked sooner.       Influenza A by PCR NEGATIVE NEGATIVE Final   Influenza B by PCR NEGATIVE NEGATIVE Final    Comment: (NOTE) The Xpert Xpress SARS-CoV-2/FLU/RSV plus assay is intended as an aid in the diagnosis of influenza from Nasopharyngeal swab specimens and should not be used as a sole basis for treatment. Nasal washings and aspirates are unacceptable for Xpert Xpress SARS-CoV-2/FLU/RSV testing.  Fact Sheet for Patients: bloggercourse.com  Fact Sheet for Healthcare Providers: seriousbroker.it  This test is not yet approved or cleared by the [place]  FDA and has been authorized for detection and/or diagnosis of SARS-CoV-2 by FDA under an Emergency Use Authorization (EUA). This EUA will remain in effect (meaning this test can be used) for the duration of the COVID-19 declaration under Section 564(b)(1) of the Act, 21 U.S.C. section 360bbb-3(b)(1), unless the authorization is terminated or revoked.     Resp Syncytial Virus by PCR NEGATIVE NEGATIVE Final    Comment: (NOTE) Fact Sheet for Patients: bloggercourse.com  Fact Sheet for Healthcare Providers: seriousbroker.it  This test is not yet approved or cleared by the [place]  FDA and has been authorized for detection and/or diagnosis of  SARS-CoV-2 by FDA under an Emergency Use Authorization (EUA). This EUA will remain in effect (meaning this test can be used) for the duration of the COVID-19 declaration under Section 564(b)(1) of the Act, 21 U.S.C. section 360bbb-3(b)(1), unless the authorization is terminated or revoked.  Performed at [facility], [address], [place], [place] [address]   Culture, blood (Routine X 2) w Reflex to ID Panel     Status: None (Preliminary result)   Collection Time: 08/13/23  2:55 AM   Specimen: BLOOD LEFT ARM  Result Value Ref Range Status   Specimen Description   Final    BLOOD LEFT ARM Performed at [facility], [address], [place], [place] [address]    Special Requests   Final    BOTTLES DRAWN AEROBIC AND ANAEROBIC Blood Culture results may not be optimal due to an inadequate volume of blood received in culture bottles Performed at [facility], [address], [place], [place] [address]  Culture   Final    NO GROWTH 2 DAYS Performed at [facility], [address] [place], [place] [address]    Report Status PENDING  Incomplete  Culture, blood (Routine X 2) w Reflex to ID Panel     Status: None (Preliminary result)   Collection Time: 08/13/23  3:11 AM   Specimen: BLOOD RIGHT FOREARM  Result Value Ref Range Status   Specimen Description   Final    BLOOD RIGHT FOREARM Performed at [facility], [address] [place], [place] [address]    Special Requests   Final    BOTTLES DRAWN AEROBIC AND ANAEROBIC Blood Culture results may not be optimal due to an inadequate volume of blood received in culture bottles Performed at [facility], [address], [place], [place] [address]    Culture   Final    NO GROWTH 2 DAYS Performed at [facility], [address], [place], [place] [address]    Report Status PENDING  Incomplete  Expectorated Sputum Assessment w Gram  Stain, Rflx to Resp Cult     Status: None   Collection Time: 08/13/23 11:33 AM   Specimen: Sputum  Result Value Ref Range Status   Specimen Description SPUTUM  Final   Special Requests Normal  Final   Sputum evaluation   Final    THIS SPECIMEN IS ACCEPTABLE FOR SPUTUM CULTURE Performed at [facility], [address] [place], [place] [address]    Report Status 08/13/2023 FINAL  Final  Culture, Respiratory w Gram Stain     Status: None (Preliminary result)   Collection Time: 08/13/23 11:33 AM   Specimen: SPU  Result Value Ref Range Status   Specimen Description   Final    SPUTUM Performed at [facility], [address] [place], [place] [address]    Special Requests   Final    Normal Reflexed from [id number] Performed at [facility], [address], [place], [place] [address]    Gram Stain   Final    ABUNDANT WBC PRESENT, PREDOMINANTLY PMN FEW GRAM POSITIVE COCCI    Culture   Final    CULTURE REINCUBATED FOR BETTER GROWTH Performed at [facility], [address], [place], [place] [address]    Report Status PENDING  Incomplete     Labs: BNP (last 3 results) Recent Labs    08/12/23 2012  BNP 47.7   Basic Metabolic Panel: Recent Labs  Lab 08/12/23 1537 08/13/23 0307 08/14/23 0552  NA 133* 129* 135  K 3.9 3.2* 4.4  CL 95* 98 102  CO2 25 22 25   GLUCOSE 126* 112* 113*  BUN 9 9 15   CREATININE 0.66 0.61 0.58  CALCIUM  9.8 8.6* 9.2  MG  --  1.5* 2.1  PHOS  --  3.2 2.5   Liver Function Tests: No results for input(s): AST, ALT, ALKPHOS, BILITOT, PROT, ALBUMIN in the last 168 hours. No results for input(s): LIPASE, AMYLASE in the last 168 hours. No results for input(s): AMMONIA in the last 168 hours. CBC: Recent Labs  Lab 08/12/23 1537 08/13/23 0307 08/14/23 0552  WBC 7.2 7.8 11.7*  NEUTROABS 5.6  --   --   HGB 16.3* 14.6 14.6  HCT 48.7* 43.9 45.6  MCV 91.5 91.5 94.6  PLT 173  165 153   Cardiac Enzymes: No results for input(s): CKTOTAL, CKMB, CKMBINDEX, TROPONINI in the last 168 hours. BNP: Invalid  input(s): POCBNP CBG: No results for input(s): GLUCAP in the last 168 hours. D-Dimer Recent Labs    08/12/23 1537  DDIMER 0.47   Hgb A1c No results for input(s): HGBA1C in the last 72 hours. Lipid Profile No results for input(s): CHOL, HDL, LDLCALC, TRIG, CHOLHDL, LDLDIRECT in the last 72 hours. Thyroid  function studies No results for input(s): TSH, T4TOTAL, T3FREE, THYROIDAB in the last 72 hours.  Invalid input(s): FREET3 Anemia work up No results for input(s): VITAMINB12, FOLATE, FERRITIN, TIBC, IRON, RETICCTPCT in the last 72 hours. Urinalysis No results found for: COLORURINE, APPEARANCEUR, LABSPEC, PHURINE, GLUCOSEU, HGBUR, BILIRUBINUR, KETONESUR, PROTEINUR, UROBILINOGEN, NITRITE, LEUKOCYTESUR Sepsis Labs Recent Labs  Lab 08/12/23 1537 08/13/23 0307 08/14/23 0552  WBC 7.2 7.8 11.7*   Microbiology Recent Results (from the past 240 hours)  Resp panel by RT-PCR (RSV, Flu A&B, Covid) Anterior Nasal Swab     Status: None   Collection Time: 08/12/23  8:12 PM   Specimen: Anterior Nasal Swab  Result Value Ref Range Status   SARS Coronavirus 2 by RT PCR NEGATIVE NEGATIVE Final    Comment: (NOTE) SARS-CoV-2 target nucleic acids are NOT DETECTED.  The SARS-CoV-2 RNA is generally detectable in upper respiratory specimens during the acute phase of infection. The lowest concentration of SARS-CoV-2 viral copies this assay can detect is 138 copies/mL. A negative result does not preclude SARS-Cov-2 infection and should not be used as the sole basis for treatment or other patient management decisions. A negative result may occur with  improper specimen collection/handling, submission of specimen other than nasopharyngeal swab, presence of viral mutation(s) within the areas targeted by  this assay, and inadequate number of viral copies(<138 copies/mL). A negative result must be combined with clinical observations, patient history, and epidemiological information. The expected result is Negative.  Fact Sheet for Patients:  bloggercourse.com  Fact Sheet for Healthcare Providers:  seriousbroker.it  This test is no t yet approved or cleared by the [place]  FDA and  has been authorized for detection and/or diagnosis of SARS-CoV-2 by FDA under an Emergency Use Authorization (EUA). This EUA will remain  in effect (meaning this test can be used) for the duration of the COVID-19 declaration under Section 564(b)(1) of the Act, 21 U.S.C.section 360bbb-3(b)(1), unless the authorization is terminated  or revoked sooner.       Influenza A by PCR NEGATIVE NEGATIVE Final   Influenza B by PCR NEGATIVE NEGATIVE Final    Comment: (NOTE) The Xpert Xpress SARS-CoV-2/FLU/RSV plus assay is intended as an aid in the diagnosis of influenza from Nasopharyngeal swab specimens and should not be used as a sole basis for treatment. Nasal washings and aspirates are unacceptable for Xpert Xpress SARS-CoV-2/FLU/RSV testing.  Fact Sheet for Patients: bloggercourse.com  Fact Sheet for Healthcare Providers: seriousbroker.it  This test is not yet approved or cleared by the [place]  FDA and has been authorized for detection and/or diagnosis of SARS-CoV-2 by FDA under an Emergency Use Authorization (EUA). This EUA will remain in effect (meaning this test can be used) for the duration of the COVID-19 declaration under Section 564(b)(1) of the Act, 21 U.S.C. section 360bbb-3(b)(1), unless the authorization is terminated or revoked.     Resp Syncytial Virus by PCR NEGATIVE NEGATIVE Final    Comment: (NOTE) Fact Sheet for Patients: bloggercourse.com  Fact Sheet  for Healthcare Providers: seriousbroker.it  This test is not yet approved or cleared by the [place]  FDA and has been authorized for detection and/or diagnosis of SARS-CoV-2 by  FDA under an Emergency Use Authorization (EUA). This EUA will remain in effect (meaning this test can be used) for the duration of the COVID-19 declaration under Section 564(b)(1) of the Act, 21 U.S.C. section 360bbb-3(b)(1), unless the authorization is terminated or revoked.  Performed at [facility], [address] [place], [place] [address]   Culture, blood (Routine X 2) w Reflex to ID Panel     Status: None (Preliminary result)   Collection Time: 08/13/23  2:55 AM   Specimen: BLOOD LEFT ARM  Result Value Ref Range Status   Specimen Description   Final    BLOOD LEFT ARM Performed at [facility], [address], [place], [place] [address]    Special Requests   Final    BOTTLES DRAWN AEROBIC AND ANAEROBIC Blood Culture results may not be optimal due to an inadequate volume of blood received in culture bottles Performed at [facility], [address], [place], [place] [address]    Culture   Final    NO GROWTH 2 DAYS Performed at [facility], [address], [place], [place] [address]    Report Status PENDING  Incomplete  Culture, blood (Routine X 2) w Reflex to ID Panel     Status: None (Preliminary result)   Collection Time: 08/13/23  3:11 AM   Specimen: BLOOD RIGHT FOREARM  Result Value Ref Range Status   Specimen Description   Final    BLOOD RIGHT FOREARM Performed at [facility], [address] [place], [place] [address]    Special Requests   Final    BOTTLES DRAWN AEROBIC AND ANAEROBIC Blood Culture results may not be optimal due to an inadequate volume of blood received in culture bottles Performed at [facility], [address] [place], [place] [address]     Culture   Final    NO GROWTH 2 DAYS Performed at [facility], [address], [place], [place] [address]    Report Status PENDING  Incomplete  Expectorated Sputum Assessment w Gram Stain, Rflx to Resp Cult     Status: None   Collection Time: 08/13/23 11:33 AM   Specimen: Sputum  Result Value Ref Range Status   Specimen Description SPUTUM  Final   Special Requests Normal  Final   Sputum evaluation   Final    THIS SPECIMEN IS ACCEPTABLE FOR SPUTUM CULTURE Performed at [facility], [address], [place], [place] [address]    Report Status 08/13/2023 FINAL  Final  Culture, Respiratory w Gram Stain     Status: None (Preliminary result)   Collection Time: 08/13/23 11:33 AM   Specimen: SPU  Result Value Ref Range Status   Specimen Description   Final    SPUTUM Performed at [facility], [address], [place], [place] [address]    Special Requests   Final    Normal Reflexed from [id number] Performed at [facility], [address] [place], [place] [address]    Gram Stain   Final    ABUNDANT WBC PRESENT, PREDOMINANTLY PMN FEW GRAM POSITIVE COCCI    Culture   Final    CULTURE REINCUBATED FOR BETTER GROWTH Performed at [facility], [address], [place], [place] [address]    Report Status PENDING  Incomplete     Time coordinating discharge: Over 30 minutes  SIGNED:   Darcel Dawley, MD  [facility] 08/15/2023,  12:39 PM Pager   If 7PM-7AM, please contact night-coverage Advised to take prednisone  40 mg p.o. daily for 3 days.

## 2023-08-15 NOTE — Plan of Care (Signed)

## 2023-08-15 NOTE — TOC Initial Note (Signed)
 Transition of Care [facility]) - Initial/Assessment Note    Patient Details  Name: Darlene Sanders MRN: [id number] Date of Birth: [birth year]  Transition of [facility]) CM/SW Contact:    Sonda Manuella Quill, RN Phone Number: 08/15/2023, 12:12 PM  Clinical Narrative:                 Spoke w/ pt in room; pt says she lives at home; she plans to return at d/c; pt verified insurance/PCP; she identified POC sister Darlene Sanders [phone]); pt will arrange transportation; she denies SDOH risks; pt says she does not have DME, HH services, or home oxygen; no TOC needs.  Expected Discharge Plan: Home/Self Care Barriers to Discharge: No Barriers Identified   Patient Goals and CMS Choice Patient states their goals for this hospitalization and ongoing recovery are:: home CMS Medicare.gov Compare Post Acute Care list provided to:: Patient Choice offered to / list presented to : Patient [place] ownership interest in [facility].provided to:: Patient    Expected Discharge Plan and Services   Discharge Planning Services: CM Consult Post Acute Care Choice: NA Living arrangements for the past 2 months: Single Family Home Expected Discharge Date: 08/15/23               DME Arranged: N/A DME Agency: NA       HH Arranged: NA HH Agency: NA        Prior Living Arrangements/Services Living arrangements for the past 2 months: Single Family Home Lives with:: Self Patient language and need for interpreter reviewed:: Yes Do you feel safe going back to the place where you live?: Yes      Need for Family Participation in Patient Care: Yes (Comment) Care giver support system in place?: Yes (comment) Current home services:  (n/a) Criminal Activity/Legal Involvement Pertinent to Current Situation/Hospitalization: No - Comment as needed  Activities of Daily Living   ADL Screening (condition at time of admission) Independently performs ADLs?: Yes (appropriate for developmental age) Is the  patient deaf or have difficulty hearing?: No Does the patient have difficulty seeing, even when wearing glasses/contacts?: No Does the patient have difficulty concentrating, remembering, or making decisions?: No  Permission Sought/Granted Permission sought to share information with : Case Manager Permission granted to share information with : Yes, Verbal Permission Granted  Share Information with NAME: Case manager     Permission granted to share info w Relationship: Darlene Sanders (sister) [phone]     Emotional Assessment Appearance:: Appears stated age Attitude/Demeanor/Rapport: Gracious Affect (typically observed): Accepting Orientation: : Oriented to Self, Oriented to Place, Oriented to  Time, Oriented to Situation Alcohol / Substance Use: Not Applicable Psych Involvement: No (comment)  Admission diagnosis:  Acute hypoxic respiratory failure (HCC) [J96.01] Patient Active Problem List   Diagnosis Date Noted   Acute hypoxic respiratory failure (HCC) 08/13/2023   History of total hip arthroplasty, right 05/08/2017   Unilateral primary osteoarthritis, right hip 08/24/2016   Status post total replacement of right hip 08/24/2016   PCP:  Aisha Harvey, MD Pharmacy:   [facility] [phone] - [place], [place] - [address] AT [address] OF [facility] [address] [place] [address] Phone: [phone] Fax: [phone]     Social Drivers of Health (SDOH) Social History: SDOH Screenings   Food Insecurity: No Food Insecurity (08/15/2023)  Housing: Low Risk  (08/15/2023)  Transportation Needs: No Transportation Needs (08/15/2023)  Utilities: Not At Risk (08/15/2023)  Social Connections: Moderately Isolated (08/13/2023)  Tobacco Use: High Risk (  08/12/2023)   SDOH Interventions: Food Insecurity Interventions: Intervention Not Indicated, Inpatient TOC Housing Interventions: Intervention Not Indicated, Inpatient TOC Transportation Interventions: Intervention  Not Indicated, Inpatient TOC Utilities Interventions: Intervention Not Indicated, Inpatient TOC   Readmission Risk Interventions     No data to display

## 2023-08-15 NOTE — Discharge Instructions (Signed)
 Advised to follow-up with primary care physician in 1 week. Advised to take Omnicef 300 mg twice daily for 3 days to complete 5-day treatment. Advised to take prednisone 40 mg p.o. daily for 3 days.

## 2023-08-18 LAB — CULTURE, BLOOD (ROUTINE X 2)
Culture: NO GROWTH
Culture: NO GROWTH

## 2023-09-05 DIAGNOSIS — Z23 Encounter for immunization: Secondary | ICD-10-CM | POA: Diagnosis not present

## 2023-09-05 DIAGNOSIS — I10 Essential (primary) hypertension: Secondary | ICD-10-CM | POA: Diagnosis not present

## 2023-09-05 DIAGNOSIS — F17219 Nicotine dependence, cigarettes, with unspecified nicotine-induced disorders: Secondary | ICD-10-CM | POA: Diagnosis not present

## 2023-09-05 DIAGNOSIS — E039 Hypothyroidism, unspecified: Secondary | ICD-10-CM | POA: Diagnosis not present

## 2023-09-05 DIAGNOSIS — F3341 Major depressive disorder, recurrent, in partial remission: Secondary | ICD-10-CM | POA: Diagnosis not present

## 2023-09-05 DIAGNOSIS — E782 Mixed hyperlipidemia: Secondary | ICD-10-CM | POA: Diagnosis not present

## 2023-09-05 DIAGNOSIS — J189 Pneumonia, unspecified organism: Secondary | ICD-10-CM | POA: Diagnosis not present

## 2023-09-05 DIAGNOSIS — R0902 Hypoxemia: Secondary | ICD-10-CM | POA: Diagnosis not present

## 2023-09-05 DIAGNOSIS — E559 Vitamin D deficiency, unspecified: Secondary | ICD-10-CM | POA: Diagnosis not present

## 2023-09-05 DIAGNOSIS — Z6823 Body mass index (BMI) 23.0-23.9, adult: Secondary | ICD-10-CM | POA: Diagnosis not present

## 2023-09-05 DIAGNOSIS — E538 Deficiency of other specified B group vitamins: Secondary | ICD-10-CM | POA: Diagnosis not present

## 2023-09-05 DIAGNOSIS — J432 Centrilobular emphysema: Secondary | ICD-10-CM | POA: Diagnosis not present

## 2023-09-05 DIAGNOSIS — J309 Allergic rhinitis, unspecified: Secondary | ICD-10-CM | POA: Diagnosis not present

## 2023-10-31 DIAGNOSIS — F331 Major depressive disorder, recurrent, moderate: Secondary | ICD-10-CM | POA: Diagnosis not present

## 2023-10-31 DIAGNOSIS — Z6824 Body mass index (BMI) 24.0-24.9, adult: Secondary | ICD-10-CM | POA: Diagnosis not present

## 2023-11-14 DIAGNOSIS — J432 Centrilobular emphysema: Secondary | ICD-10-CM | POA: Diagnosis not present

## 2023-11-14 DIAGNOSIS — N182 Chronic kidney disease, stage 2 (mild): Secondary | ICD-10-CM | POA: Diagnosis not present

## 2023-11-14 DIAGNOSIS — I10 Essential (primary) hypertension: Secondary | ICD-10-CM | POA: Diagnosis not present

## 2023-11-14 DIAGNOSIS — F331 Major depressive disorder, recurrent, moderate: Secondary | ICD-10-CM | POA: Diagnosis not present

## 2023-11-18 DIAGNOSIS — L814 Other melanin hyperpigmentation: Secondary | ICD-10-CM | POA: Diagnosis not present

## 2023-11-18 DIAGNOSIS — L578 Other skin changes due to chronic exposure to nonionizing radiation: Secondary | ICD-10-CM | POA: Diagnosis not present

## 2023-11-18 DIAGNOSIS — Z87898 Personal history of other specified conditions: Secondary | ICD-10-CM | POA: Diagnosis not present

## 2023-11-18 DIAGNOSIS — L821 Other seborrheic keratosis: Secondary | ICD-10-CM | POA: Diagnosis not present

## 2023-11-18 DIAGNOSIS — D225 Melanocytic nevi of trunk: Secondary | ICD-10-CM | POA: Diagnosis not present

## 2023-11-18 DIAGNOSIS — D2271 Melanocytic nevi of right lower limb, including hip: Secondary | ICD-10-CM | POA: Diagnosis not present

## 2023-12-11 DIAGNOSIS — I10 Essential (primary) hypertension: Secondary | ICD-10-CM | POA: Diagnosis not present

## 2023-12-11 DIAGNOSIS — E039 Hypothyroidism, unspecified: Secondary | ICD-10-CM | POA: Diagnosis not present

## 2023-12-11 DIAGNOSIS — N182 Chronic kidney disease, stage 2 (mild): Secondary | ICD-10-CM | POA: Diagnosis not present

## 2023-12-11 DIAGNOSIS — J432 Centrilobular emphysema: Secondary | ICD-10-CM | POA: Diagnosis not present

## 2023-12-13 DIAGNOSIS — R7309 Other abnormal glucose: Secondary | ICD-10-CM | POA: Diagnosis not present

## 2023-12-13 DIAGNOSIS — I10 Essential (primary) hypertension: Secondary | ICD-10-CM | POA: Diagnosis not present

## 2023-12-13 DIAGNOSIS — N182 Chronic kidney disease, stage 2 (mild): Secondary | ICD-10-CM | POA: Diagnosis not present

## 2023-12-13 DIAGNOSIS — E039 Hypothyroidism, unspecified: Secondary | ICD-10-CM | POA: Diagnosis not present

## 2023-12-13 DIAGNOSIS — E782 Mixed hyperlipidemia: Secondary | ICD-10-CM | POA: Diagnosis not present

## 2023-12-13 DIAGNOSIS — J432 Centrilobular emphysema: Secondary | ICD-10-CM | POA: Diagnosis not present

## 2023-12-17 DIAGNOSIS — F331 Major depressive disorder, recurrent, moderate: Secondary | ICD-10-CM | POA: Diagnosis not present

## 2023-12-17 DIAGNOSIS — I10 Essential (primary) hypertension: Secondary | ICD-10-CM | POA: Diagnosis not present

## 2023-12-17 DIAGNOSIS — R801 Persistent proteinuria, unspecified: Secondary | ICD-10-CM | POA: Diagnosis not present

## 2023-12-17 DIAGNOSIS — Z6826 Body mass index (BMI) 26.0-26.9, adult: Secondary | ICD-10-CM | POA: Diagnosis not present

## 2023-12-17 DIAGNOSIS — E538 Deficiency of other specified B group vitamins: Secondary | ICD-10-CM | POA: Diagnosis not present

## 2023-12-17 DIAGNOSIS — N182 Chronic kidney disease, stage 2 (mild): Secondary | ICD-10-CM | POA: Diagnosis not present

## 2023-12-17 DIAGNOSIS — Z0000 Encounter for general adult medical examination without abnormal findings: Secondary | ICD-10-CM | POA: Diagnosis not present

## 2023-12-17 DIAGNOSIS — E039 Hypothyroidism, unspecified: Secondary | ICD-10-CM | POA: Diagnosis not present

## 2023-12-17 DIAGNOSIS — E559 Vitamin D deficiency, unspecified: Secondary | ICD-10-CM | POA: Diagnosis not present

## 2023-12-17 DIAGNOSIS — J309 Allergic rhinitis, unspecified: Secondary | ICD-10-CM | POA: Diagnosis not present

## 2023-12-17 DIAGNOSIS — R7303 Prediabetes: Secondary | ICD-10-CM | POA: Diagnosis not present

## 2023-12-17 DIAGNOSIS — E782 Mixed hyperlipidemia: Secondary | ICD-10-CM | POA: Diagnosis not present

## 2024-01-11 DIAGNOSIS — N182 Chronic kidney disease, stage 2 (mild): Secondary | ICD-10-CM | POA: Diagnosis not present

## 2024-01-11 DIAGNOSIS — E039 Hypothyroidism, unspecified: Secondary | ICD-10-CM | POA: Diagnosis not present

## 2024-01-11 DIAGNOSIS — I10 Essential (primary) hypertension: Secondary | ICD-10-CM | POA: Diagnosis not present

## 2024-01-11 DIAGNOSIS — J432 Centrilobular emphysema: Secondary | ICD-10-CM | POA: Diagnosis not present

## 2024-01-12 DIAGNOSIS — J432 Centrilobular emphysema: Secondary | ICD-10-CM | POA: Diagnosis not present

## 2024-01-12 DIAGNOSIS — I10 Essential (primary) hypertension: Secondary | ICD-10-CM | POA: Diagnosis not present

## 2024-01-12 DIAGNOSIS — N182 Chronic kidney disease, stage 2 (mild): Secondary | ICD-10-CM | POA: Diagnosis not present

## 2024-01-17 ENCOUNTER — Other Ambulatory Visit (INDEPENDENT_AMBULATORY_CARE_PROVIDER_SITE_OTHER): Payer: Self-pay

## 2024-01-17 ENCOUNTER — Ambulatory Visit: Admitting: Physician Assistant

## 2024-01-17 ENCOUNTER — Encounter: Payer: Self-pay | Admitting: Physician Assistant

## 2024-01-17 DIAGNOSIS — M25562 Pain in left knee: Secondary | ICD-10-CM

## 2024-01-17 DIAGNOSIS — G8929 Other chronic pain: Secondary | ICD-10-CM | POA: Diagnosis not present

## 2024-01-17 MED ORDER — LIDOCAINE HCL 1 % IJ SOLN
4.0000 mL | INTRAMUSCULAR | Status: AC | PRN
  Administered 2024-01-17: 4 mL

## 2024-01-17 MED ORDER — METHYLPREDNISOLONE ACETATE 40 MG/ML IJ SUSP
40.0000 mg | INTRAMUSCULAR | Status: AC | PRN
  Administered 2024-01-17: 40 mg via INTRA_ARTICULAR

## 2024-01-17 NOTE — Progress Notes (Signed)
 Office Visit Note   Patient: Darlene Sanders           Date of Birth: 08/05/1946           MRN: [id number] Visit Date: 01/17/2024              Requested by: Helyn Lobstein, MD [address] [place],  [place] [address] PCP: Helyn Lobstein, MD   Assessment & Plan: Visit Diagnoses: Left knee pain  Plan: Pleasant active 78 year old woman with a chief complaint of left knee pain and swelling.  No fever chills or particular injury.  She does have some early arthritis.  She does have a moderate effusion.  Discussed going forward with aspiration and injection today she is agreed to this plan  Follow-Up Instructions: No follow-ups on file.   Orders:  Orders Placed This Encounter  Procedures  . XR Knee 1-2 Views Left   No orders of the defined types were placed in this encounter.     Procedures: Large Joint Inj: L knee on 01/17/2024 1:43 PM Indications: pain and diagnostic evaluation Details: 25 G 1.5 in needle, superolateral approach  Arthrogram: No  Medications: 40 mg methylPREDNISolone  acetate 40 MG/ML; 4 mL lidocaine  1 % Aspirate: 75 mL yellow Outcome: tolerated well, no immediate complications  After verbal consent was obtained superior lateral pouch was prepped with alcohol and Betadine.  4 cc of lidocaine  plain was injected.  After adequate analgesia 18-gauge needle aspirated 75 cc of yellow fluid from her knee.  After that Depo-Medrol  was injected Ace wrap applied she tolerated procedure well Procedure, treatment alternatives, risks and benefits explained, specific risks discussed. Consent was given by the patient.     Clinical Data: No additional findings.   Subjective: No chief complaint on file.   HPI patient is a 78 year old woman with a chief complaint of left knee pain.  Been going on for about 4 to 5 weeks she notices it is warm to touch.  Pain keeps her up at night.  She is not taking any medication no history of injuries she says last x-rays were in 2018  denies any fever chills  Review of Systems  All other systems reviewed and are negative.    Objective: Vital Signs: There were no vitals taken for this visit.  Physical Exam Constitutional:      Appearance: Normal appearance.  Pulmonary:     Effort: Pulmonary effort is normal.  Skin:    General: Skin is warm and dry.  Neurological:     General: No focal deficit present.     Mental Status: She is alert and oriented to person, place, and time.    Ortho Exam Left knee neurovascular intact she has no redness no erythema no cellulitis compartments are soft and nontender she does have a moderate effusion of the left knee.  She has good stability no particular tenderness along the joint line Specialty Comments:  No specialty comments available.  Imaging: No results found.   PMFS History: Patient Active Problem List   Diagnosis Date Noted  . What 01/17/2024  . Acute hypoxic respiratory failure (HCC) 08/13/2023  . History of total hip arthroplasty, right 05/08/2017  . Unilateral primary osteoarthritis, right hip 08/24/2016  . Status post total replacement of right hip 08/24/2016   Past Medical History:  Diagnosis Date  . Arthritis    osteoarthiritis related  . Asthma    many years ago, age 31  . Cancer [facility]) 2012   facial melanoma removal ;  MOHs  . Depression   . Diverticulitis   . Headache   . Hypertension   . Hypothyroidism     History reviewed. No pertinent family history.  Past Surgical History:  Procedure Laterality Date  . ABDOMINAL PERINEAL BOWEL RESECTION  2007  . COLONOSCOPY  2017  . INCISIONAL HERNIA REPAIR  2008  . TONSILLECTOMY     years ago, childhood  . TOTAL HIP ARTHROPLASTY Right 08/24/2016   Procedure: RIGHT TOTAL HIP ARTHROPLASTY ANTERIOR APPROACH;  Surgeon: Arnie Lao, MD;  Location: [facility]S;  Service: Orthopedics;  Laterality: Right;   Social History   Occupational History  . Not on file  Tobacco Use  . Smoking status: Every  Day    Current packs/day: 0.75    Average packs/day: 0.8 packs/day for 59.0 years (44.3 ttl pk-yrs)    Types: Cigarettes  . Smokeless tobacco: Never  Substance and Sexual Activity  . Alcohol use: Yes    Comment: social drinker  . Drug use: No  . Sexual activity: Never

## 2024-02-07 DIAGNOSIS — M1990 Unspecified osteoarthritis, unspecified site: Secondary | ICD-10-CM | POA: Diagnosis not present

## 2024-02-10 DIAGNOSIS — E039 Hypothyroidism, unspecified: Secondary | ICD-10-CM | POA: Diagnosis not present

## 2024-02-10 DIAGNOSIS — N182 Chronic kidney disease, stage 2 (mild): Secondary | ICD-10-CM | POA: Diagnosis not present

## 2024-02-10 DIAGNOSIS — J432 Centrilobular emphysema: Secondary | ICD-10-CM | POA: Diagnosis not present

## 2024-02-10 DIAGNOSIS — I10 Essential (primary) hypertension: Secondary | ICD-10-CM | POA: Diagnosis not present

## 2024-02-12 DIAGNOSIS — K625 Hemorrhage of anus and rectum: Secondary | ICD-10-CM | POA: Diagnosis not present

## 2024-02-12 DIAGNOSIS — R194 Change in bowel habit: Secondary | ICD-10-CM | POA: Diagnosis not present

## 2024-02-23 ENCOUNTER — Other Ambulatory Visit: Payer: Self-pay

## 2024-02-23 ENCOUNTER — Inpatient Hospital Stay (HOSPITAL_COMMUNITY)
Admission: EM | Admit: 2024-02-23 | Discharge: 2024-02-28 | DRG: 378 | Disposition: A | Discharge status: Home / Self Care | Attending: Internal Medicine | Admitting: Internal Medicine

## 2024-02-23 ENCOUNTER — Emergency Department (HOSPITAL_COMMUNITY)

## 2024-02-23 DIAGNOSIS — K640 First degree hemorrhoids: Secondary | ICD-10-CM | POA: Diagnosis not present

## 2024-02-23 DIAGNOSIS — Z79890 Hormone replacement therapy (postmenopausal): Secondary | ICD-10-CM

## 2024-02-23 DIAGNOSIS — F17210 Nicotine dependence, cigarettes, uncomplicated: Secondary | ICD-10-CM | POA: Diagnosis not present

## 2024-02-23 DIAGNOSIS — K5731 Diverticulosis of large intestine without perforation or abscess with bleeding: Principal | ICD-10-CM | POA: Diagnosis present

## 2024-02-23 DIAGNOSIS — K921 Melena: Secondary | ICD-10-CM | POA: Diagnosis not present

## 2024-02-23 DIAGNOSIS — F32A Depression, unspecified: Secondary | ICD-10-CM | POA: Diagnosis present

## 2024-02-23 DIAGNOSIS — Z96641 Presence of right artificial hip joint: Secondary | ICD-10-CM | POA: Diagnosis not present

## 2024-02-23 DIAGNOSIS — Z885 Allergy status to narcotic agent status: Secondary | ICD-10-CM | POA: Diagnosis not present

## 2024-02-23 DIAGNOSIS — Z888 Allergy status to other drugs, medicaments and biological substances status: Secondary | ICD-10-CM | POA: Diagnosis not present

## 2024-02-23 DIAGNOSIS — I959 Hypotension, unspecified: Secondary | ICD-10-CM | POA: Diagnosis not present

## 2024-02-23 DIAGNOSIS — K6389 Other specified diseases of intestine: Secondary | ICD-10-CM | POA: Diagnosis not present

## 2024-02-23 DIAGNOSIS — E869 Volume depletion, unspecified: Secondary | ICD-10-CM | POA: Diagnosis not present

## 2024-02-23 DIAGNOSIS — K5780 Diverticulitis of intestine, part unspecified, with perforation and abscess without bleeding: Secondary | ICD-10-CM | POA: Diagnosis not present

## 2024-02-23 DIAGNOSIS — T81509A Unspecified complication of foreign body accidentally left in body following unspecified procedure, initial encounter: Secondary | ICD-10-CM | POA: Diagnosis not present

## 2024-02-23 DIAGNOSIS — N179 Acute kidney failure, unspecified: Secondary | ICD-10-CM | POA: Diagnosis not present

## 2024-02-23 DIAGNOSIS — I700 Atherosclerosis of aorta: Secondary | ICD-10-CM | POA: Diagnosis present

## 2024-02-23 DIAGNOSIS — F419 Anxiety disorder, unspecified: Secondary | ICD-10-CM | POA: Diagnosis present

## 2024-02-23 DIAGNOSIS — K920 Hematemesis: Secondary | ICD-10-CM | POA: Diagnosis not present

## 2024-02-23 DIAGNOSIS — Z79899 Other long term (current) drug therapy: Secondary | ICD-10-CM

## 2024-02-23 DIAGNOSIS — I10 Essential (primary) hypertension: Secondary | ICD-10-CM | POA: Diagnosis not present

## 2024-02-23 DIAGNOSIS — Z880 Allergy status to penicillin: Secondary | ICD-10-CM

## 2024-02-23 DIAGNOSIS — E876 Hypokalemia: Secondary | ICD-10-CM | POA: Diagnosis present

## 2024-02-23 DIAGNOSIS — R935 Abnormal findings on diagnostic imaging of other abdominal regions, including retroperitoneum: Secondary | ICD-10-CM | POA: Diagnosis not present

## 2024-02-23 DIAGNOSIS — R000 Tachycardia, unspecified: Secondary | ICD-10-CM | POA: Diagnosis present

## 2024-02-23 DIAGNOSIS — K625 Hemorrhage of anus and rectum: Secondary | ICD-10-CM | POA: Diagnosis not present

## 2024-02-23 DIAGNOSIS — K529 Noninfective gastroenteritis and colitis, unspecified: Secondary | ICD-10-CM | POA: Diagnosis present

## 2024-02-23 DIAGNOSIS — E039 Hypothyroidism, unspecified: Secondary | ICD-10-CM | POA: Diagnosis present

## 2024-02-23 DIAGNOSIS — Z85820 Personal history of malignant melanoma of skin: Secondary | ICD-10-CM

## 2024-02-23 DIAGNOSIS — Z91040 Latex allergy status: Secondary | ICD-10-CM | POA: Diagnosis not present

## 2024-02-23 DIAGNOSIS — K633 Ulcer of intestine: Secondary | ICD-10-CM | POA: Diagnosis not present

## 2024-02-23 DIAGNOSIS — E785 Hyperlipidemia, unspecified: Secondary | ICD-10-CM | POA: Diagnosis not present

## 2024-02-23 DIAGNOSIS — D62 Acute posthemorrhagic anemia: Secondary | ICD-10-CM | POA: Diagnosis present

## 2024-02-23 DIAGNOSIS — R58 Hemorrhage, not elsewhere classified: Secondary | ICD-10-CM | POA: Diagnosis not present

## 2024-02-23 DIAGNOSIS — K626 Ulcer of anus and rectum: Secondary | ICD-10-CM | POA: Diagnosis present

## 2024-02-23 DIAGNOSIS — E871 Hypo-osmolality and hyponatremia: Secondary | ICD-10-CM | POA: Diagnosis not present

## 2024-02-23 DIAGNOSIS — K5730 Diverticulosis of large intestine without perforation or abscess without bleeding: Secondary | ICD-10-CM | POA: Diagnosis not present

## 2024-02-23 DIAGNOSIS — K922 Gastrointestinal hemorrhage, unspecified: Secondary | ICD-10-CM | POA: Diagnosis not present

## 2024-02-23 LAB — COMPREHENSIVE METABOLIC PANEL WITH GFR
ALT: 12 U/L (ref 0–44)
AST: 21 U/L (ref 15–41)
Albumin: 2.9 g/dL — ABNORMAL LOW (ref 3.5–5.0)
Alkaline Phosphatase: 46 U/L (ref 38–126)
Anion gap: 10 (ref 5–15)
BUN: 23 mg/dL (ref 8–23)
CO2: 22 mmol/L (ref 22–32)
Calcium: 9.1 mg/dL (ref 8.9–10.3)
Chloride: 99 mmol/L (ref 98–111)
Creatinine, Ser: 1.35 mg/dL — ABNORMAL HIGH (ref 0.44–1.00)
GFR, Estimated: 40 mL/min — ABNORMAL LOW (ref >60)
Glucose, Bld: 112 mg/dL — ABNORMAL HIGH (ref 70–99)
Potassium: 3.3 mmol/L — ABNORMAL LOW (ref 3.5–5.1)
Sodium: 131 mmol/L — ABNORMAL LOW (ref 135–145)
Total Bilirubin: 0.7 mg/dL (ref 0.0–1.2)
Total Protein: 6.2 g/dL — ABNORMAL LOW (ref 6.5–8.1)

## 2024-02-23 LAB — CBC WITH DIFFERENTIAL/PLATELET
Abs Immature Granulocytes: 0.08 K/uL — ABNORMAL HIGH (ref 0.00–0.07)
Basophils Absolute: 0.1 K/uL (ref 0.0–0.1)
Basophils Relative: 1 %
Eosinophils Absolute: 0.1 K/uL (ref 0.0–0.5)
Eosinophils Relative: 1 %
HCT: 39.4 % (ref 36.0–46.0)
Hemoglobin: 12.7 g/dL (ref 12.0–15.0)
Immature Granulocytes: 1 %
Lymphocytes Relative: 13 %
Lymphs Abs: 1.4 K/uL (ref 0.7–4.0)
MCH: 29.5 pg (ref 26.0–34.0)
MCHC: 32.2 g/dL (ref 30.0–36.0)
MCV: 91.6 fL (ref 80.0–100.0)
Monocytes Absolute: 1.6 K/uL — ABNORMAL HIGH (ref 0.1–1.0)
Monocytes Relative: 14 %
Neutro Abs: 7.9 K/uL — ABNORMAL HIGH (ref 1.7–7.7)
Neutrophils Relative %: 70 %
Platelets: 409 K/uL — ABNORMAL HIGH (ref 150–400)
RBC: 4.3 MIL/uL (ref 3.87–5.11)
RDW: 12.7 % (ref 11.5–15.5)
WBC: 11.2 K/uL — ABNORMAL HIGH (ref 4.0–10.5)
nRBC: 0 % (ref 0.0–0.2)

## 2024-02-23 LAB — I-STAT CHEM 8, ED
BUN: 25 mg/dL — ABNORMAL HIGH (ref 8–23)
Calcium, Ion: 1.14 mmol/L — ABNORMAL LOW (ref 1.15–1.40)
Chloride: 95 mmol/L — ABNORMAL LOW (ref 98–111)
Creatinine, Ser: 1.4 mg/dL — ABNORMAL HIGH (ref 0.44–1.00)
Glucose, Bld: 112 mg/dL — ABNORMAL HIGH (ref 70–99)
HCT: 36 % (ref 36.0–46.0)
Hemoglobin: 12.2 g/dL (ref 12.0–15.0)
Potassium: 3.2 mmol/L — ABNORMAL LOW (ref 3.5–5.1)
Sodium: 132 mmol/L — ABNORMAL LOW (ref 135–145)
TCO2: 25 mmol/L (ref 22–32)

## 2024-02-23 LAB — TYPE AND SCREEN
ABO/RH(D): O POS
Antibody Screen: NEGATIVE

## 2024-02-23 LAB — I-STAT CG4 LACTIC ACID, ED: Lactic Acid, Venous: 1.7 mmol/L (ref 0.5–1.9)

## 2024-02-23 LAB — PROTIME-INR
INR: 1.1 (ref 0.8–1.2)
Prothrombin Time: 15.1 s (ref 11.4–15.2)

## 2024-02-23 LAB — POC OCCULT BLOOD, ED: Fecal Occult Bld: POSITIVE — AB

## 2024-02-23 MED ORDER — SODIUM CHLORIDE 0.9 % IV SOLN
2.0000 g | Freq: Every day | INTRAVENOUS | Status: DC
  Administered 2024-02-24 – 2024-02-27 (×4): 2 g via INTRAVENOUS
  Filled 2024-02-23 (×4): qty 20

## 2024-02-23 MED ORDER — VITAMIN B-12 1000 MCG PO TABS
500.0000 ug | ORAL_TABLET | Freq: Every day | ORAL | Status: DC
  Administered 2024-02-24 – 2024-02-28 (×5): 500 ug via ORAL
  Filled 2024-02-23 (×5): qty 1

## 2024-02-23 MED ORDER — SODIUM CHLORIDE 0.9 % IV SOLN: INTRAVENOUS | Status: AC

## 2024-02-23 MED ORDER — POTASSIUM CHLORIDE 10 MEQ/100ML IV SOLN
10.0000 meq | INTRAVENOUS | Status: AC
  Administered 2024-02-24 (×2): 10 meq via INTRAVENOUS
  Filled 2024-02-23 (×2): qty 100

## 2024-02-23 MED ORDER — MAGNESIUM SULFATE IN D5W 1-5 GM/100ML-% IV SOLN
1.0000 g | Freq: Once | INTRAVENOUS | Status: AC
  Administered 2024-02-24: 1 g via INTRAVENOUS
  Filled 2024-02-23: qty 100

## 2024-02-23 MED ORDER — VITAMIN D 25 MCG (1000 UNIT) PO TABS
2000.0000 [IU] | ORAL_TABLET | ORAL | Status: DC
  Administered 2024-02-24 – 2024-02-28 (×3): 2000 [IU] via ORAL
  Filled 2024-02-23 (×3): qty 2

## 2024-02-23 MED ORDER — PANTOPRAZOLE SODIUM 40 MG IV SOLR
40.0000 mg | Freq: Once | INTRAVENOUS | Status: AC
  Administered 2024-02-23: 40 mg via INTRAVENOUS
  Filled 2024-02-23: qty 10

## 2024-02-23 MED ORDER — LEVOTHYROXINE SODIUM 88 MCG PO TABS
88.0000 ug | ORAL_TABLET | Freq: Every day | ORAL | Status: DC
  Administered 2024-02-24 – 2024-02-28 (×5): 88 ug via ORAL
  Filled 2024-02-23 (×5): qty 1

## 2024-02-23 MED ORDER — METRONIDAZOLE 500 MG/100ML IV SOLN
500.0000 mg | Freq: Two times a day (BID) | INTRAVENOUS | Status: DC
  Administered 2024-02-24 – 2024-02-27 (×8): 500 mg via INTRAVENOUS
  Filled 2024-02-23 (×8): qty 100

## 2024-02-23 MED ORDER — ESCITALOPRAM OXALATE 10 MG PO TABS
15.0000 mg | ORAL_TABLET | Freq: Every day | ORAL | Status: DC
  Administered 2024-02-24 – 2024-02-28 (×5): 15 mg via ORAL
  Filled 2024-02-23 (×5): qty 2

## 2024-02-23 MED ORDER — SODIUM CHLORIDE 0.9 % IV BOLUS
1000.0000 mL | Freq: Once | INTRAVENOUS | Status: AC
  Administered 2024-02-23: 1000 mL via INTRAVENOUS

## 2024-02-23 MED ORDER — IOHEXOL 350 MG/ML SOLN
100.0000 mL | Freq: Once | INTRAVENOUS | Status: AC | PRN
  Administered 2024-02-23: 100 mL via INTRAVENOUS

## 2024-02-23 NOTE — ED Notes (Signed)
 Pt ambulated to the bathroom, pt had steady gait and no complaints

## 2024-02-23 NOTE — H&P (Signed)
 History and Physical  Darlene Sanders FMW:[id number] DOB: 1946/02/17 DOA: 02/23/2024  Referring physician: Minnie Maxwell, PA-EDP  PCP: Aisha Harvey, MD  Outpatient Specialists: Pulmonary, GI. Patient coming from: Home.  Chief Complaint: Rectal bleeding.  HPI: Darlene Sanders is a 78 y.o. female with medical history significant for hypertension, hyperlipidemia, hypothyroidism, chronic anxiety/depression, who presented to the ER, sent from urgent care where she initially presented with complaints of rectal bleeding x 6 weeks, worsened over the last week.  Not on anticoagulations.  Her rectal bleeding occurs with or without bowel movements.  Endorses lightheadedness.  She saw GI 10 days ago and is scheduled for a colonoscopy on July 21.  The patient was hypotensive with SBP's in the 80s and tachycardic with heart rate in the 120s at the urgent care.  Due to concern for symptomatic anemia and possible hemorrhagic shock, she was administered IV fluid and transferred via EMS to the ED for further evaluation.  In the ER, SBP in the high 80s.  She received 1 L NS IV fluid bolus.  CT angio GI bleed revealed descending and sigmoid diverticulosis.  Fat stranding of the sigmoid colon with a prominent diverticulum or ulcerated outpouching at the superior aspect of the mid sigmoid colon measuring 3.2 x 3.0 cm.  There appears to be a small linear radiopaque foreign body within the wall and adjacent mesentery fat of the fundus of the outpouching measuring 1.0 cm in length this is possibly the chronic sequela of a diverticular abscess which has retained an ingested foreign body.  Severe circumferential wall thickening of the rectum with extensive perirectal and presacral fat stranding.  Generally most consistent with nonspecific infectious or inflammatory proctitis the rectal mass is distinctly not excluded given this appearance.  TRH, hospitalist service, was asked to admit.  Started IV Rocephin  and IV Flagyl  until an  active infective process could be ruled out.    ED Course: Temperature 99.6.  BP 90/60, pulse 88, respiratory 18, O2 saturation 96% on room air.  Lab studies notable for serum sodium 132, potassium 3.2, BUN 25, creatinine 1.40, GFR 40.  WBC 11.2, platelet count 493 count 7.9.  Review of Systems: Review of systems as noted in the HPI. All other systems reviewed and are negative.   Past Medical History:  Diagnosis Date   Arthritis    osteoarthiritis related   Asthma    many years ago, age 69   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 ; MOHs   Depression    Diverticulitis    Headache    Hypertension    Hypothyroidism    Past Surgical History:  Procedure Laterality Date   ABDOMINAL PERINEAL BOWEL RESECTION  2007   COLONOSCOPY  2017   INCISIONAL HERNIA REPAIR  2008   TONSILLECTOMY     years ago, childhood   TOTAL HIP ARTHROPLASTY Right 08/24/2016   Procedure: RIGHT TOTAL HIP ARTHROPLASTY ANTERIOR APPROACH;  Surgeon: Lonni CINDERELLA Poli, MD;  Location: [facility];  Service: Orthopedics;  Laterality: Right;    Social History:  reports that she has been smoking cigarettes. She has a 44.3 pack-year smoking history. She has never used smokeless tobacco. She reports current alcohol use. She reports that she does not use drugs.   Allergies  Allergen Reactions   Codeine Nausea Only   Fluorescein Other (See Comments)    Pt says she passed out   Hydromorphone  Nausea Only   Latex    Penicillins Rash    Has  patient had a PCN reaction causing immediate rash, facial/tongue/throat swelling, SOB or lightheadedness with hypotension: unknown Has patient had a PCN reaction causing severe rash involving mucus membranes or skin necrosis: Yes Has patient had a PCN reaction that required hospitalization No Has patient had a PCN reaction occurring within the last 10 years: No If all of the above answers are NO, then may proceed with Cephalosporin use.     Family history: None  reported.  Prior to Admission medications   Medication Sig Start Date End Date Taking? Authorizing Provider  Cholecalciferol  (VITAMIN D3) 50 MCG (2000 UT) CAPS Take 2,000 Units by mouth every other day.   Yes [provider]  Cyanocobalamin  (VITAMIN B 12) 500 MCG TABS Take 500 mcg by mouth daily.   Yes [provider]  escitalopram  (LEXAPRO ) 10 MG tablet Take 15 mg by mouth daily. 02/14/24  Yes [provider]  losartan -hydrochlorothiazide  (HYZAAR) 100-12.5 MG tablet Take 1 tablet by mouth daily. 05/31/23  Yes [provider]  rosuvastatin  (CRESTOR ) 20 MG tablet Take 20 mg by mouth daily.   Yes [provider]  SYNTHROID  88 MCG tablet Take 88 mcg by mouth daily before breakfast.  04/30/16  Yes [provider]    Physical Exam: BP 99/71   Pulse 90   Temp 99.6 F (37.6 C) (Oral)   Resp 11   Ht 5' 6 (1.676 m)   Wt 61.2 kg   SpO2 98%   BMI 21.79 kg/m   General: 78 y.o. year-old female well developed well nourished in no acute distress.  Alert and oriented x3. Cardiovascular: Regular rate and rhythm with no rubs or gallops.  No thyromegaly or JVD noted.  No lower extremity edema. 2/4 pulses in all 4 extremities. Respiratory: Clear to auscultation with no wheezes or rales. Good inspiratory effort. Abdomen: Soft nontender nondistended with normal bowel sounds x4 quadrants. Muskuloskeletal: No cyanosis, clubbing or edema noted bilaterally Neuro: CN II-XII intact, strength, sensation, reflexes Skin: No ulcerative lesions noted or rashes Psychiatry: Judgement and insight appear normal. Mood is appropriate for condition and setting          Labs on Admission:  Basic Metabolic Panel: Recent Labs  Lab 02/23/24 1944 02/23/24 1958  NA 131* 132*  K 3.3* 3.2*  CL 99 95*  CO2 22  --   GLUCOSE 112* 112*  BUN 23 25*  CREATININE 1.35* 1.40*  CALCIUM  9.1  --    Liver Function Tests: Recent Labs  Lab 02/23/24 1944  AST 21  ALT 12   ALKPHOS 46  BILITOT 0.7  PROT 6.2*  ALBUMIN 2.9*   No results for input(s): LIPASE, AMYLASE in the last 168 hours. No results for input(s): AMMONIA in the last 168 hours. CBC: Recent Labs  Lab 02/23/24 1944 02/23/24 1958  WBC 11.2*  --   NEUTROABS 7.9*  --   HGB 12.7 12.2  HCT 39.4 36.0  MCV 91.6  --   PLT 409*  --    Cardiac Enzymes: No results for input(s): CKTOTAL, CKMB, CKMBINDEX, TROPONINI in the last 168 hours.  BNP (last 3 results) Recent Labs    08/12/23 2012  BNP 47.7    ProBNP (last 3 results) No results for input(s): PROBNP in the last 8760 hours.  CBG: No results for input(s): GLUCAP in the last 168 hours.  Radiological Exams on Admission: CT ANGIO GI BLEED Result Date: 02/23/2024 CLINICAL DATA:  GI bleed * Tracking Code: BO * EXAM: CTA ABDOMEN AND  PELVIS WITHOUT AND WITH CONTRAST TECHNIQUE: Multidetector CT imaging of the abdomen and pelvis was performed using the standard protocol during bolus administration of intravenous contrast. Multiplanar reconstructed images and MIPs were obtained and reviewed to evaluate the vascular anatomy. RADIATION DOSE REDUCTION: This exam was performed according to the departmental dose-optimization program which includes automated exposure control, adjustment of the mA and/or kV according to patient size and/or use of iterative reconstruction technique. CONTRAST:  OMNIPAQUE  IOHEXOL  350 MG/ML SOLN COMPARISON:  None Available. FINDINGS: VASCULAR Normal contour and caliber of the abdominal aorta. No evidence of aneurysm, dissection, or other acute aortic pathology. Standard branching pattern of the abdominal aorta with solitary bilateral renal arteries. Moderate aortic atherosclerosis. Review of the MIP images confirms the above findings. NON-VASCULAR Lower Chest: No acute findings.  Small hiatal hernia. Hepatobiliary: No solid liver abnormality is seen. No gallstones, gallbladder wall thickening, or biliary  dilatation. Pancreas: Unremarkable. No pancreatic ductal dilatation or surrounding inflammatory changes. Spleen: Normal in size without significant abnormality. Adrenals/Urinary Tract: Adrenal glands are unremarkable. Simple, benign bilateral renal cortical cysts, for which no further follow-up or characterization is required. Kidneys are otherwise normal, without renal calculi, solid lesion, or hydronephrosis. Bladder is unremarkable. Stomach/Bowel: Stomach is within normal limits. Appendix appears normal. Descending and sigmoid diverticulosis. Fat stranding of the sigmoid colon with a prominent diverticulum or ulcerated outpouching at the superior aspect of the mid sigmoid colon measuring 3.2 x 3.0 cm (series 16, image 66). There appears to be a small, linear radiopaque foreign body within the wall and adjacent mesenteric fat of the fundus of the outpouching measuring 1.0 cm in length. Severe circumferential wall thickening of the rectum with extensive perirectal and presacral fat stranding (series 9, image 73). Lymphatic: No enlarged abdominal or pelvic lymph nodes. Reproductive: Fibroid of the uterine fundus (series 9, image 64). Other: Ventral hernia mesh repair.  No ascites. Musculoskeletal: No acute osseous findings. Status post right hip total arthroplasty. IMPRESSION: 1. Descending and sigmoid diverticulosis. Fat stranding of the sigmoid colon with a prominent diverticulum or ulcerated outpouching at the superior aspect of the mid sigmoid colon measuring 3.2 x 3.0 cm. There appears to be a small, linear radiopaque foreign body within the wall and adjacent mesenteric fat of the fundus of the outpouching measuring 1.0 cm in length. This is possibly this chronic sequelae of a diverticular abscess which has retained an ingested foreign body. 2. Severe circumferential wall thickening of the rectum with extensive perirectal and presacral fat stranding, generally most consistent with nonspecific infectious or  inflammatory proctitis but rectal mass is distinctly not excluded given this appearance. 3. No intraluminal contrast extravasation or other findings to specifically localize nidus of GI bleeding. 4. Normal contour and caliber of the abdominal aorta. No evidence of aneurysm, dissection, or other acute aortic pathology. Moderate aortic atherosclerosis. Aortic Atherosclerosis (ICD10-I70.0). Electronically Signed   By: Marolyn JONETTA Jaksch M.D.   On: 02/23/2024 20:53    EKG: I independently viewed the EKG done and my findings are as followed: Sinus tachycardia rate of 102.  Nonspecific ST-T changes QTc 452.  Assessment/Plan Present on Admission:  Rectal bleeding  Principal Problem:   Rectal bleeding  Chronic rectal bleeding Endorses rectal bleeding for the past 6 weeks worse in the last week Hemoglobin 12.2 Monitor H&H, transfuse as indicated. Hold off home oral antihypertensives to maintain MAP greater than 65 Continue IV fluid hydration GI consulted by EDP for possible endoscopy  Acute blood loss anemia Symptomatic anemia Endorses lightheadedness Hemoglobin  stable at 12.2.  Euvolemic hyponatremia Serum sodium 132 Continue NS IV fluid Repeat BMP in the morning  AKI, suspect prerenal in the setting of dehydration  Baseline creatinine 0.58 with GFR greater than 60. Presented with creatinine 1.40 with GFR of 40. Avoid nephrotoxic agents, dehydration, hypotension. Repeat BMP in the morning.  Hypokalemia, likely from GI losses secondary to frequent stools Serum potassium 3.2 Repleted intravenously with IV Kcl Milligram IV magnesium  x 1.  Chronic anxiety/depression Resume home regimen.   Critical care time: 65 minutes.   DVT prophylaxis: SCDs  Code Status: Full code.  Family Communication: None at bedside.  Disposition Plan: Admitted to progressive care unit.  Consults called: GI consulted by ED.  Admission status: Inpatient status.   Status is: Inpatient The patient  requires at least 2 midnights for further evaluation and treatment of present condition.   Terry LOISE Hurst MD [facility] Pager [phone]  If 7PM-7AM, please contact night-coverage [email or web address] Password [facility]  02/23/2024, 11:09 PM

## 2024-02-23 NOTE — ED Triage Notes (Signed)
 Pt BIB [facility] from the Urgent care. Per report pt has had rectal  bleeding for 6 wks; has gotten progressively worse with diarrhea for   the last 10 days. Pt reports today the rectal bleeding as been continuous;  light headed and dizzy.   500ml bolus given at UC  120 HR, 86/67 BP

## 2024-02-23 NOTE — ED Provider Notes (Signed)
 [place] EMERGENCY DEPARTMENT AT [facility] Provider Note   CSN: [id number] Arrival date & time: 02/23/24  1928     Patient presents with: Rectal Bleeding   Darlene Sanders is a 78 y.o. female with past medical history of HTN, asthma, hypothyroidism presents to emergency department via EMS from urgent care for evaluation of rectal bleeding over the past 6 weeks that is progressively worsened over the last 10 days.  Reports that she has bright red blood per rectum and has been continuous today.  Bleeding can occur with and without bowel movements.  Last colonoscopy reported in 2017. No endoscopy on chart review. Scheduled to have colonoscopy 03/02/24. Complains of associated lightheadedness, dizziness. No thinners  Of note, at urgent care BP was noted to be 86/67 and heart rate 120 bpm with associated pallor and diaphoresis.  They provided 1 L fluids PTA    Rectal Bleeding      Prior to Admission medications   Medication Sig Start Date End Date Taking? Authorizing Provider  Cholecalciferol  (VITAMIN D3) 50 MCG (2000 UT) CAPS Take 2,000 Units by mouth every other day.   Yes [provider]  Cyanocobalamin  (VITAMIN B 12) 500 MCG TABS Take 500 mcg by mouth daily.   Yes [provider]  escitalopram  (LEXAPRO ) 10 MG tablet Take 15 mg by mouth daily. 02/14/24  Yes [provider]  losartan -hydrochlorothiazide  (HYZAAR) 100-12.5 MG tablet Take 1 tablet by mouth daily. 05/31/23  Yes [provider]  rosuvastatin  (CRESTOR ) 20 MG tablet Take 20 mg by mouth daily.   Yes [provider]  SYNTHROID  88 MCG tablet Take 88 mcg by mouth daily before breakfast.  04/30/16  Yes [provider]    Allergies: Codeine, Fluorescein, Hydromorphone , Latex, and Penicillins    Review of Systems  Gastrointestinal:  Positive for hematochezia.    Updated Vital Signs BP (!) 95/57   Pulse 89   Temp 99.6 F (37.6 C) (Oral)   Resp 12   Ht 5' 6  (1.676 m)   Wt 61.2 kg   SpO2 97%   BMI 21.79 kg/m   Physical Exam Vitals and nursing note reviewed.  Constitutional:      General: She is not in acute distress.    Appearance: Normal appearance. She is not ill-appearing.  HENT:     Head: Normocephalic and atraumatic.  Eyes:     General: Lids are normal. Vision grossly intact.     Conjunctiva/sclera: Conjunctivae normal.     Comments: No subconjunctival pallor  Cardiovascular:     Rate and Rhythm: Normal rate.  Pulmonary:     Effort: Pulmonary effort is normal. No respiratory distress.  Genitourinary:    Rectum: Guaiac result positive.     Comments: No gross blood, external hemorrhoids noted on external exam.  Internal exam with very small amount of melena. Normal rectal tone Skin:    Capillary Refill: Capillary refill takes less than 2 seconds.     Coloration: Skin is not jaundiced or pale.  Neurological:     Mental Status: She is alert and oriented to person, place, and time. Mental status is at baseline.    Annabella Salt RN chaperoned GU exam  (all labs ordered are listed, but only abnormal results are displayed) Labs Reviewed  CBC WITH DIFFERENTIAL/PLATELET - Abnormal; Notable for the following components:      Result Value   WBC 11.2 (*)    Platelets 409 (*)    Neutro Abs 7.9 (*)  Monocytes Absolute 1.6 (*)    Abs Immature Granulocytes 0.08 (*)    All other components within normal limits  COMPREHENSIVE METABOLIC PANEL WITH GFR - Abnormal; Notable for the following components:   Sodium 131 (*)    Potassium 3.3 (*)    Glucose, Bld 112 (*)    Creatinine, Ser 1.35 (*)    Total Protein 6.2 (*)    Albumin 2.9 (*)    GFR, Estimated 40 (*)    All other components within normal limits  I-STAT CHEM 8, ED - Abnormal; Notable for the following components:   Sodium 132 (*)    Potassium 3.2 (*)    Chloride 95 (*)    BUN 25 (*)    Creatinine, Ser 1.40 (*)    Glucose, Bld 112 (*)    Calcium , Ion 1.14 (*)    All  other components within normal limits  POC OCCULT BLOOD, ED - Abnormal; Notable for the following components:   Fecal Occult Bld POSITIVE (*)    All other components within normal limits  PROTIME-INR  I-STAT CG4 LACTIC ACID, ED  I-STAT CG4 LACTIC ACID, ED  TYPE AND SCREEN    EKG: None  Radiology: CT ANGIO GI BLEED Result Date: 02/23/2024 CLINICAL DATA:  GI bleed * Tracking Code: BO * EXAM: CTA ABDOMEN AND PELVIS WITHOUT AND WITH CONTRAST TECHNIQUE: Multidetector CT imaging of the abdomen and pelvis was performed using the standard protocol during bolus administration of intravenous contrast. Multiplanar reconstructed images and MIPs were obtained and reviewed to evaluate the vascular anatomy. RADIATION DOSE REDUCTION: This exam was performed according to the departmental dose-optimization program which includes automated exposure control, adjustment of the mA and/or kV according to patient size and/or use of iterative reconstruction technique. CONTRAST:  OMNIPAQUE  IOHEXOL  350 MG/ML SOLN COMPARISON:  None Available. FINDINGS: VASCULAR Normal contour and caliber of the abdominal aorta. No evidence of aneurysm, dissection, or other acute aortic pathology. Standard branching pattern of the abdominal aorta with solitary bilateral renal arteries. Moderate aortic atherosclerosis. Review of the MIP images confirms the above findings. NON-VASCULAR Lower Chest: No acute findings.  Small hiatal hernia. Hepatobiliary: No solid liver abnormality is seen. No gallstones, gallbladder wall thickening, or biliary dilatation. Pancreas: Unremarkable. No pancreatic ductal dilatation or surrounding inflammatory changes. Spleen: Normal in size without significant abnormality. Adrenals/Urinary Tract: Adrenal glands are unremarkable. Simple, benign bilateral renal cortical cysts, for which no further follow-up or characterization is required. Kidneys are otherwise normal, without renal calculi, solid lesion, or  hydronephrosis. Bladder is unremarkable. Stomach/Bowel: Stomach is within normal limits. Appendix appears normal. Descending and sigmoid diverticulosis. Fat stranding of the sigmoid colon with a prominent diverticulum or ulcerated outpouching at the superior aspect of the mid sigmoid colon measuring 3.2 x 3.0 cm (series 16, image 66). There appears to be a small, linear radiopaque foreign body within the wall and adjacent mesenteric fat of the fundus of the outpouching measuring 1.0 cm in length. Severe circumferential wall thickening of the rectum with extensive perirectal and presacral fat stranding (series 9, image 73). Lymphatic: No enlarged abdominal or pelvic lymph nodes. Reproductive: Fibroid of the uterine fundus (series 9, image 64). Other: Ventral hernia mesh repair.  No ascites. Musculoskeletal: No acute osseous findings. Status post right hip total arthroplasty. IMPRESSION: 1. Descending and sigmoid diverticulosis. Fat stranding of the sigmoid colon with a prominent diverticulum or ulcerated outpouching at the superior aspect of the mid sigmoid colon measuring 3.2 x 3.0 cm. There appears to be  a small, linear radiopaque foreign body within the wall and adjacent mesenteric fat of the fundus of the outpouching measuring 1.0 cm in length. This is possibly this chronic sequelae of a diverticular abscess which has retained an ingested foreign body. 2. Severe circumferential wall thickening of the rectum with extensive perirectal and presacral fat stranding, generally most consistent with nonspecific infectious or inflammatory proctitis but rectal mass is distinctly not excluded given this appearance. 3. No intraluminal contrast extravasation or other findings to specifically localize nidus of GI bleeding. 4. Normal contour and caliber of the abdominal aorta. No evidence of aneurysm, dissection, or other acute aortic pathology. Moderate aortic atherosclerosis. Aortic Atherosclerosis (ICD10-I70.0).  Electronically Signed   By: Marolyn JONETTA Jaksch M.D.   On: 02/23/2024 20:53     Medications Ordered in the ED  levothyroxine  (SYNTHROID ) tablet 88 mcg (has no administration in time range)  escitalopram  (LEXAPRO ) tablet 15 mg (has no administration in time range)  Vitamin B 12 TABS 500 mcg (has no administration in time range)  vitamin D3 capsule 2,000 Units (has no administration in time range)  sodium chloride  0.9 % bolus 1,000 mL (1,000 mLs Intravenous New Bag/Given 02/23/24 2002)  pantoprazole  (PROTONIX ) injection 40 mg (40 mg Intravenous Given 02/23/24 2002)  iohexol  (OMNIPAQUE ) 350 MG/ML injection 100 mL (100 mLs Intravenous Contrast Given 02/23/24 2040)                                    Medical Decision Making Amount and/or Complexity of Data Reviewed Labs: ordered. Radiology: ordered.  Risk Prescription drug management. Decision regarding hospitalization.   Patient presents to the ED for concern of rectal bleeding, this involves an extensive number of treatment options, and is a complaint that carries with it a high risk of complications and morbidity.  The differential diagnosis includes injury/trauma, hemorrhoid, internal hemorrhoid, GIB, AVM   Co morbidities that complicate the patient evaluation  See HPI   Additional history obtained:  Additional history obtained from Nursing and Outside Medical Records   External records from outside source obtained and reviewed including ER note, urgent care note   Lab Tests:  I Ordered, and personally interpreted labs.  The pertinent results include:   Sodium 132 Potassium 3.3 Creatinine 1.35 BUN 25 CBG 112 WBC 11.2 Lactic 1.7 PLT 409 Fecal occult positive   Imaging Studies ordered:  I ordered imaging studies including CTA abdomen  I independently visualized and interpreted imaging which showed  Descending and sigmoid diverticulosis. Fat stranding of the sigmoid colon with a prominent diverticulum or ulcerated  outpouching at the superior aspect of the mid sigmoid colon measuring 3.2 x 3.0 cm. There appears to be a small, linear radiopaque foreign body within the wall and adjacent mesenteric fat of the fundus of the outpouching measuring 1.0 cm in length. This is possibly this chronic sequelae of a diverticular abscess which has retained an foreign body. Severe circumferential wall thickening of the rectum with extensive perirectal and presacral fat stranding, generally most consistent with nonspecific infectious or inflammatory proctitis but rectal mass is distinctly not excluded given this appearance. No intraluminal contrast extravasation or other findings to specifically localize nidus of GI bleeding. I agree with the radiologist interpretation   Cardiac Monitoring:  The patient was maintained on a cardiac monitor.  I personally viewed and interpreted the cardiac monitored which showed an underlying rhythm of: Sinus tachycardia 1 to 2 bpm with no  ST or T wave abnormalities   Medicines ordered and prescription drug management:  I ordered medication including NS, protonix   for possible GIB  Reevaluation of the patient after these medicines showed that the patient improved I have reviewed the patients home medicines and have made adjustments as needed     Consultations Obtained:  I requested consultation with [facility] Dr. Abran,  and discussed lab and imaging findings as well as pertinent plan -  [facility] pt - reach out to [facility] I secure chat [facility] Dr. Burnette, and discussed lab and imaging findings as well as pertinent plan I requested consultation with hospitalist Dr Shona,  and discussed lab and imaging findings as well as pertinent plan -accepts patient for admission   Problem List / ED Course:  Rectal bleeding Was tachycardic and hypotension at urgent care prior to arrival but has since been hemodynamically stable since obtaining 1 L fluids PTA No anemia.  Lactic acid WNL No gross  rectal bleeding on exam but occult + No obvious localization of bleeding on CTA In ED, patient continues to have soft SBP 95-104 despite having 2 L of fluid and I think patient would benefit from blood pressure observation overnight No complaints of chest pain, shortness of breath, abdominal pain, nausea, vomiting   Reevaluation:  After the interventions noted above, I reevaluated the patient and found that they have :improved    Dispostion:  After consideration of the diagnostic results and the patients response to treatment, I feel that the patent would benefit from admission for rectal bleeding, GI consult, bp obs   Discussed ED workup, dispo with patient expressed understanding agrees with plan.  All questions answered to her satisfaction.  Final diagnoses:  Rectal bleeding    ED Discharge Orders     None          Minnie Tinnie BRAVO, PA 02/23/24 2302    Ula Prentice SAUNDERS, MD 02/24/24 [phone]

## 2024-02-24 ENCOUNTER — Encounter (HOSPITAL_COMMUNITY): Payer: Self-pay | Admitting: Internal Medicine

## 2024-02-24 DIAGNOSIS — K625 Hemorrhage of anus and rectum: Secondary | ICD-10-CM | POA: Diagnosis not present

## 2024-02-24 LAB — RENAL FUNCTION PANEL
Albumin: 2.7 g/dL — ABNORMAL LOW (ref 3.5–5.0)
Anion gap: 10 (ref 5–15)
BUN: 18 mg/dL (ref 8–23)
CO2: 25 mmol/L (ref 22–32)
Calcium: 8.8 mg/dL — ABNORMAL LOW (ref 8.9–10.3)
Chloride: 99 mmol/L (ref 98–111)
Creatinine, Ser: 1.19 mg/dL — ABNORMAL HIGH (ref 0.44–1.00)
GFR, Estimated: 47 mL/min — ABNORMAL LOW (ref >60)
Glucose, Bld: 108 mg/dL — ABNORMAL HIGH (ref 70–99)
Phosphorus: 2.5 mg/dL (ref 2.5–4.6)
Potassium: 3.5 mmol/L (ref 3.5–5.1)
Sodium: 134 mmol/L — ABNORMAL LOW (ref 135–145)

## 2024-02-24 LAB — HEMOGLOBIN AND HEMATOCRIT, BLOOD
HCT: 36 % (ref 36.0–46.0)
Hemoglobin: 11.7 g/dL — ABNORMAL LOW (ref 12.0–15.0)

## 2024-02-24 LAB — MAGNESIUM: Magnesium: 1.9 mg/dL (ref 1.7–2.4)

## 2024-02-24 MED ORDER — POLYETHYLENE GLYCOL 3350 17 GM/SCOOP PO POWD
238.0000 g | Freq: Once | ORAL | Status: AC
  Administered 2024-02-24: 238 g via ORAL
  Filled 2024-02-24: qty 238

## 2024-02-24 MED ORDER — SODIUM CHLORIDE 0.9 % IV SOLN: INTRAVENOUS | Status: AC

## 2024-02-24 MED ORDER — PEG 3350-KCL-NA BICARB-NACL 420 G PO SOLR
4000.0000 mL | Freq: Once | ORAL | Status: AC
  Administered 2024-02-25: 4000 mL via ORAL
  Filled 2024-02-24: qty 4000

## 2024-02-24 NOTE — ED Notes (Signed)
 PT has had a bout of nausea.

## 2024-02-24 NOTE — ED Notes (Signed)
 Called lab to add on H&H.

## 2024-02-24 NOTE — ED Notes (Signed)
 [facility] called to be alerted that this pt is on the way up

## 2024-02-24 NOTE — Progress Notes (Signed)
 PROGRESS NOTE    Darlene Sanders  FMW:[id number] DOB: June 09, 1946 DOA: 02/23/2024 PCP: Aisha Harvey, MD  Outpatient Specialists:     Brief Narrative:  Patient is a 78 year old female with past medical history significant for diverticulosis, hypertension, hyperlipidemia and hypothyroidism.  Patient presented with watery diarrhea and bright red blood per rectum.  No associated fever or chills.  No abdominal pain, nausea or vomiting.  Patient is known to Dr. Rosalie, GI physician.  GI team had planned to proceed with colonoscopy around the later part of the month.  Patient also reported feeling very weak, likely from volume depletion.  Patient has had chronic and intermittent hyponatremia.  BMP done on presentation revealed sodium of 131, potassium of 3.3, chloride of 99, CO2 of 22, BUN of 23, serum creatinine of 1.35 (baseline serum creatinine of 0.58), magnesium  of 1.9, albumin of 2.9, WBC of 11.2 and hemoglobin of 12.7 (baseline hemoglobin of 14.6 g/dL).  Vital signs reviewed revealed intermittent low blood pressure and mild tachycardia (but responded to volume resuscitation).  CT angio GI bleed revealed: 1. Descending and sigmoid diverticulosis. Fat stranding of the sigmoid colon with a prominent diverticulum or ulcerated outpouching at the superior aspect of the mid sigmoid colon measuring 3.2 x 3.0 cm. There appears to be a small, linear radiopaque foreign body within the wall and adjacent mesenteric fat of the fundus of the outpouching measuring 1.0 cm in length. This is possibly this chronic sequelae of a diverticular abscess which has retained an ingested foreign body. 2. Severe circumferential wall thickening of the rectum with extensive perirectal and presacral fat stranding, generally most consistent with nonspecific infectious or inflammatory proctitis but rectal mass is distinctly not excluded given this appearance. 3. No intraluminal contrast extravasation or other findings  to specifically localize nidus of GI bleeding. 4. Normal contour and caliber of the abdominal aorta. No evidence of aneurysm, dissection, or other acute aortic pathology. Moderate aortic atherosclerosis.   Aortic Atherosclerosis  02/24/2024: Patient seen.  Patient continues to have diarrhea and rectal bleeding.  Awaiting GI input.  Patient is currently on IV Rocephin  and Flagyl .   Assessment & Plan:   Principal Problem:   Rectal bleeding   Diarrhea/rectal bleeding:  -Endorses painful rectal bleeding for the past 6 weeks worse in the last week -Reports 5 diarrhea episodes yesterday, and 3 during the night. - Baseline hemoglobin of 14.6 g/dL. - Hemoglobin this morning is 11.7 g/dL. - CT angio abdomen for GI bleed revealed descending and sigmoid colon diverticulosis, possible foreign body in the sigmoid area and proctitis. - Patient is known to the GI team, Dr. Rosalie.  Colonoscopy was planned on an outpatient basis. - Patient is currently an IV ceftriaxone  and Flagyl . - Continue volume resuscitation. - Continue to monitor H/H. - Follow WBC. - Awaiting GI input.    Acute blood loss anemia Symptomatic anemia -See above documentation.   - Fatigue has resolved with volume resuscitation.   - Hemoglobin of 11.7 g/dL this morning.     Hyponatremia: -Intermittent, and chronic. - Likely related to volume depletion. - Sodium of 131 on presentation, improved to 134 with hydration. - Continue to monitor closely.  Volume depletion: - Secondary to GI loss. - Continue IV fluids. - Strict I's and O's.    Acute kidney injury: - Likely prerenal (volume depletion). - Serum creatinine has improved from 1.4-1.19. - Continue to monitor renal function and electrolytes.    Hypokalemia, likely from GI losses secondary to frequent stools -On presentation,  potassium was 3.3. - Potassium has improved to 3.5. - Continue to replace potassium (as needed). - Magnesium  of 1.9.     Chronic  anxiety/depression Resume home regimen.   DVT prophylaxis: SCD Code Status: Full code. Family Communication:  Disposition Plan: Inpatient.     Consultants:  GI.  Procedures:  None.  Antimicrobials:  IV Rocephin . IV Flagyl .   Subjective: -Continues to report diarrhea stools and rectal bleed.  Objective: Vitals:   02/24/24 0515 02/24/24 0516 02/24/24 0645 02/24/24 0700  BP: 105/63  104/65 101/64  Pulse: 84  71 71  Resp: (!) 23  10 10   Temp:  98.5 F (36.9 C)    TempSrc:  Oral    SpO2: 92%  99% 96%  Weight:      Height:       No intake or output data in the 24 hours ending 02/24/24 0714 Filed Weights   02/23/24 1937  Weight: 61.2 kg    Examination:  General exam: Appears calm and comfortable.  Patient is pale. Respiratory system: Clear to auscultation.  Cardiovascular system: S1 & S2  Gastrointestinal system: Abdomen is soft and nontender.   Central nervous system: Alert and oriented.  Extremities: No leg edema.  Data Reviewed: I have personally reviewed following labs and imaging studies  CBC: Recent Labs  Lab 02/23/24 1944 02/23/24 1958  WBC 11.2*  --   NEUTROABS 7.9*  --   HGB 12.7 12.2  HCT 39.4 36.0  MCV 91.6  --   PLT 409*  --    Basic Metabolic Panel: Recent Labs  Lab 02/23/24 1944 02/23/24 1958  NA 131* 132*  K 3.3* 3.2*  CL 99 95*  CO2 22  --   GLUCOSE 112* 112*  BUN 23 25*  CREATININE 1.35* 1.40*  CALCIUM  9.1  --    GFR: Estimated Creatinine Clearance: 31.5 mL/min (A) (by C-G formula based on SCr of 1.4 mg/dL (H)). Liver Function Tests: Recent Labs  Lab 02/23/24 1944  AST 21  ALT 12  ALKPHOS 46  BILITOT 0.7  PROT 6.2*  ALBUMIN 2.9*   No results for input(s): LIPASE, AMYLASE in the last 168 hours. No results for input(s): AMMONIA in the last 168 hours. Coagulation Profile: Recent Labs  Lab 02/23/24 1944  INR 1.1   Cardiac Enzymes: No results for input(s): CKTOTAL, CKMB, CKMBINDEX, TROPONINI in  the last 168 hours. BNP (last 3 results) No results for input(s): PROBNP in the last 8760 hours. HbA1C: No results for input(s): HGBA1C in the last 72 hours. CBG: No results for input(s): GLUCAP in the last 168 hours. Lipid Profile: No results for input(s): CHOL, HDL, LDLCALC, TRIG, CHOLHDL, LDLDIRECT in the last 72 hours. Thyroid  Function Tests: No results for input(s): TSH, T4TOTAL, FREET4, T3FREE, THYROIDAB in the last 72 hours. Anemia Panel: No results for input(s): VITAMINB12, FOLATE, FERRITIN, TIBC, IRON, RETICCTPCT in the last 72 hours. Urine analysis: No results found for: COLORURINE, APPEARANCEUR, LABSPEC, PHURINE, GLUCOSEU, HGBUR, BILIRUBINUR, KETONESUR, PROTEINUR, UROBILINOGEN, NITRITE, LEUKOCYTESUR Sepsis Labs: @LABRCNTIP (procalcitonin:4,lacticidven:4)  )No results found for this or any previous visit (from the past 240 hours).       Radiology Studies: CT ANGIO GI BLEED Result Date: 02/23/2024 CLINICAL DATA:  GI bleed * Tracking Code: BO * EXAM: CTA ABDOMEN AND PELVIS WITHOUT AND WITH CONTRAST TECHNIQUE: Multidetector CT imaging of the abdomen and pelvis was performed using the standard protocol during bolus administration of intravenous contrast. Multiplanar reconstructed images and MIPs were obtained and reviewed to evaluate  the vascular anatomy. RADIATION DOSE REDUCTION: This exam was performed according to the departmental dose-optimization program which includes automated exposure control, adjustment of the mA and/or kV according to patient size and/or use of iterative reconstruction technique. CONTRAST:  OMNIPAQUE  IOHEXOL  350 MG/ML SOLN COMPARISON:  None Available. FINDINGS: VASCULAR Normal contour and caliber of the abdominal aorta. No evidence of aneurysm, dissection, or other acute aortic pathology. Standard branching pattern of the abdominal aorta with solitary bilateral renal arteries. Moderate  aortic atherosclerosis. Review of the MIP images confirms the above findings. NON-VASCULAR Lower Chest: No acute findings.  Small hiatal hernia. Hepatobiliary: No solid liver abnormality is seen. No gallstones, gallbladder wall thickening, or biliary dilatation. Pancreas: Unremarkable. No pancreatic ductal dilatation or surrounding inflammatory changes. Spleen: Normal in size without significant abnormality. Adrenals/Urinary Tract: Adrenal glands are unremarkable. Simple, benign bilateral renal cortical cysts, for which no further follow-up or characterization is required. Kidneys are otherwise normal, without renal calculi, solid lesion, or hydronephrosis. Bladder is unremarkable. Stomach/Bowel: Stomach is within normal limits. Appendix appears normal. Descending and sigmoid diverticulosis. Fat stranding of the sigmoid colon with a prominent diverticulum or ulcerated outpouching at the superior aspect of the mid sigmoid colon measuring 3.2 x 3.0 cm (series 16, image 66). There appears to be a small, linear radiopaque foreign body within the wall and adjacent mesenteric fat of the fundus of the outpouching measuring 1.0 cm in length. Severe circumferential wall thickening of the rectum with extensive perirectal and presacral fat stranding (series 9, image 73). Lymphatic: No enlarged abdominal or pelvic lymph nodes. Reproductive: Fibroid of the uterine fundus (series 9, image 64). Other: Ventral hernia mesh repair.  No ascites. Musculoskeletal: No acute osseous findings. Status post right hip total arthroplasty. IMPRESSION: 1. Descending and sigmoid diverticulosis. Fat stranding of the sigmoid colon with a prominent diverticulum or ulcerated outpouching at the superior aspect of the mid sigmoid colon measuring 3.2 x 3.0 cm. There appears to be a small, linear radiopaque foreign body within the wall and adjacent mesenteric fat of the fundus of the outpouching measuring 1.0 cm in length. This is possibly this chronic  sequelae of a diverticular abscess which has retained an ingested foreign body. 2. Severe circumferential wall thickening of the rectum with extensive perirectal and presacral fat stranding, generally most consistent with nonspecific infectious or inflammatory proctitis but rectal mass is distinctly not excluded given this appearance. 3. No intraluminal contrast extravasation or other findings to specifically localize nidus of GI bleeding. 4. Normal contour and caliber of the abdominal aorta. No evidence of aneurysm, dissection, or other acute aortic pathology. Moderate aortic atherosclerosis. Aortic Atherosclerosis (ICD10-I70.0). Electronically Signed   By: Marolyn JONETTA Jaksch M.D.   On: 02/23/2024 20:53        Scheduled Meds:  cholecalciferol   2,000 Units Oral QODAY   cyanocobalamin   500 mcg Oral Daily   escitalopram   15 mg Oral Daily   levothyroxine   88 mcg Oral QAC breakfast   Continuous Infusions:  sodium chloride  100 mL/hr at 02/24/24 0056   cefTRIAXone  (ROCEPHIN )  IV Stopped (02/24/24 0150)   metronidazole  Stopped (02/24/24 0356)     LOS: 1 day    Time spent: 55 minutes.    Leatrice Chapel, MD  [facility] Pager #: [phone] 7PM-7AM contact night coverage as above

## 2024-02-24 NOTE — ED Notes (Signed)
 PT stool starting to have some some bright  red blood (minimal) in it .

## 2024-02-24 NOTE — ED Notes (Signed)
 Floor called and made aware that PT is on their way upstairs.

## 2024-02-24 NOTE — ED Notes (Signed)
 PT was unhooked and ambulated to the bathroom without assistance.

## 2024-02-24 NOTE — Consult Note (Signed)
 Referring Provider: Dr. Rosario Primary Care Physician:  Aisha Harvey, MD Primary Gastroenterologist:  Dr. Rosalie  Reason for Consultation:  Rectal bleeding  HPI: Darlene Sanders is a 78 y.o. female with bright red blood per rectum (BRBPR) intermittently for 6 weeks that worsened this past weekend with profuse BRBPR. Denies abdominal pain, dizziness, N/V, black stools. Advised to go to urgent care and they sent her to the ER. Last colonoscopy over 10 years ago (11/2011) and seen by Dr. Rosalie on 7/2 and outpt colonoscopy scheduled for next week. Hgb 11.7. Denies NSAIDs. Occasional alcohol. Nurse in room.  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 Authorizing Provider  Cholecalciferol  (VITAMIN D3) 50 MCG (2000 UT) CAPS Take 2,000 Units by mouth every other day.   Yes [provider]  Cyanocobalamin  (VITAMIN B 12) 500 MCG TABS Take 500 mcg by mouth daily.   Yes [provider]  escitalopram  (LEXAPRO ) 10 MG tablet Take 15 mg by mouth daily. 02/14/24  Yes [provider]  losartan -hydrochlorothiazide  (HYZAAR) 100-12.5 MG tablet Take 1 tablet by mouth daily. 05/31/23  Yes [provider]  rosuvastatin  (CRESTOR ) 20 MG tablet Take 20 mg by mouth daily.   Yes [provider]  SYNTHROID   88 MCG tablet Take 88 mcg by mouth daily before breakfast.  04/30/16  Yes [provider]    Scheduled Meds:  cholecalciferol   2,000 Units Oral QODAY   cyanocobalamin   500 mcg Oral Daily   escitalopram   15 mg Oral Daily   levothyroxine   88 mcg Oral QAC breakfast   Continuous Infusions:  sodium chloride  100 mL/hr at 02/24/24 0056   cefTRIAXone  (ROCEPHIN )  IV Stopped (02/24/24 0150)   metronidazole  500 mg (02/24/24 1020)   PRN Meds:.  Allergies as of 02/23/2024 - Review Complete 02/23/2024  Allergen Reaction Noted   Codeine Nausea Only 05/20/2012   Fluorescein Other (See Comments) 05/20/2012   Hydromorphone  Nausea Only 11/11/2012   Latex  08/13/2023   Penicillins Rash 05/20/2012    No family history on file.  Social History   Socioeconomic History   Marital status: Divorced    Spouse name: Not on file   Number of children: Not on file   Years of education: Not on file   Highest education level: Not on file  Occupational History   Not on file  Tobacco Use   Smoking status: Every Day    Current packs/day: 0.75    Average packs/day: 0.8 packs/day for 59.0 years (44.3 ttl pk-yrs)    Types: Cigarettes   Smokeless tobacco: Never  Substance and Sexual Activity   Alcohol use: Yes    Comment: social drinker   Drug use: No  Sexual activity: Never  Other Topics Concern   Not on file  Social History Narrative   Not on file   Social Drivers of Health   Financial Resource Strain: Not on file  Food Insecurity: No Food Insecurity (08/15/2023)   Hunger Vital Sign    Worried About Running Out of Food in the Last Year: Never true    Ran Out of Food in the Last Year: Never true  Transportation Needs: No Transportation Needs (08/15/2023)   PRAPARE - Administrator, Civil Service (Medical): No    Lack of Transportation (Non-Medical): No  Physical Activity: Not on file  Stress: Not on file  Social Connections: Moderately Isolated (08/13/2023)   Social  Connection and Isolation Panel    Frequency of Communication with Friends and Family: More than three times a week    Frequency of Social Gatherings with Friends and Family: Three times a week    Attends Religious Services: 1 to 4 times per year    Active Member of Clubs or Organizations: No    Attends Banker Meetings: Never    Marital Status: Divorced  Catering manager Violence: Not At Risk (08/15/2023)   Humiliation, Afraid, Rape, and Kick questionnaire    Fear of Current or Ex-Partner: No    Emotionally Abused: No    Physically Abused: No    Sexually Abused: No    Review of Systems: All negative except as stated above in HPI.  Physical Exam: Vital signs: Vitals:   02/24/24 0800 02/24/24 1027  BP: 99/62   Pulse: 73   Resp: (!) 25   Temp:  98.6 F (37 C)  SpO2: 96%    Last BM Date : 02/24/24 General:  lethargic, elderly, well-nourished, no acute distress  Head: normocephalic, atraumatic Eyes: anicteric sclera ENT: oropharynx clear Neck: supple, nontender Lungs:  Clear throughout to auscultation.   No wheezes, crackles, or rhonchi. No acute distress. Heart:  Regular rate and rhythm; no murmurs, clicks, rubs,  or gallops. Abdomen: soft, nontender, nondistended, +BS  Rectal:  Deferred Ext: edematous right hand, no LE edema  GI:  Lab Results: Recent Labs    02/23/24 1944 02/23/24 1958 02/24/24 0418  WBC 11.2*  --   --   HGB 12.7 12.2 11.7*  HCT 39.4 36.0 36.0  PLT 409*  --   --    BMET Recent Labs    02/23/24 1944 02/23/24 1958 02/24/24 0418  NA 131* 132* 134*  K 3.3* 3.2* 3.5  CL 99 95* 99  CO2 22  --  25  GLUCOSE 112* 112* 108*  BUN 23 25* 18  CREATININE 1.35* 1.40* 1.19*  CALCIUM  9.1  --  8.8*   LFT Recent Labs    02/23/24 1944 02/24/24 0418  PROT 6.2*  --   ALBUMIN 2.9* 2.7*  AST 21  --   ALT 12  --   ALKPHOS 46  --   BILITOT 0.7  --    PT/INR Recent Labs    02/23/24 1944  LABPROT 15.1  INR 1.1      Studies/Results: CT ANGIO GI BLEED Result Date: 02/23/2024 CLINICAL DATA:  GI bleed * Tracking Code: BO * EXAM: CTA ABDOMEN AND PELVIS WITHOUT AND WITH CONTRAST TECHNIQUE: Multidetector CT imaging of the abdomen and pelvis was performed using the standard protocol during bolus administration of intravenous contrast. Multiplanar reconstructed images and MIPs were obtained and reviewed to evaluate the vascular anatomy. RADIATION DOSE REDUCTION: This exam was performed  according to the departmental dose-optimization program which includes automated exposure control, adjustment of the mA and/or kV according to patient size and/or use of iterative reconstruction technique. CONTRAST:  OMNIPAQUE  IOHEXOL  350 MG/ML SOLN COMPARISON:  None Available. FINDINGS: VASCULAR Normal contour and caliber of the abdominal aorta. No evidence of aneurysm, dissection, or other acute aortic pathology. Standard branching pattern of the abdominal aorta with solitary bilateral renal arteries. Moderate aortic atherosclerosis. Review of the MIP images confirms the above findings. NON-VASCULAR Lower Chest: No acute findings.  Small hiatal hernia. Hepatobiliary: No solid liver abnormality is seen. No gallstones, gallbladder wall thickening, or biliary dilatation. Pancreas: Unremarkable. No pancreatic ductal dilatation or surrounding inflammatory changes. Spleen: Normal in size without significant abnormality. Adrenals/Urinary Tract: Adrenal glands are unremarkable. Simple, benign bilateral renal cortical cysts, for which no further follow-up or characterization is required. Kidneys are otherwise normal, without renal calculi, solid lesion, or hydronephrosis. Bladder is unremarkable. Stomach/Bowel: Stomach is within normal limits. Appendix appears normal. Descending and sigmoid diverticulosis. Fat stranding of the sigmoid colon with a prominent diverticulum or ulcerated outpouching at the superior aspect of the mid sigmoid colon  measuring 3.2 x 3.0 cm (series 16, image 66). There appears to be a small, linear radiopaque foreign body within the wall and adjacent mesenteric fat of the fundus of the outpouching measuring 1.0 cm in length. Severe circumferential wall thickening of the rectum with extensive perirectal and presacral fat stranding (series 9, image 73). Lymphatic: No enlarged abdominal or pelvic lymph nodes. Reproductive: Fibroid of the uterine fundus (series 9, image 64). Other: Ventral hernia mesh repair.  No ascites. Musculoskeletal: No acute osseous findings. Status post right hip total arthroplasty. IMPRESSION: 1. Descending and sigmoid diverticulosis. Fat stranding of the sigmoid colon with a prominent diverticulum or ulcerated outpouching at the superior aspect of the mid sigmoid colon measuring 3.2 x 3.0 cm. There appears to be a small, linear radiopaque foreign body within the wall and adjacent mesenteric fat of the fundus of the outpouching measuring 1.0 cm in length. This is possibly this chronic sequelae of a diverticular abscess which has retained an ingested foreign body. 2. Severe circumferential wall thickening of the rectum with extensive perirectal and presacral fat stranding, generally most consistent with nonspecific infectious or inflammatory proctitis but rectal mass is distinctly not excluded given this appearance. 3. No intraluminal contrast extravasation or other findings to specifically localize nidus of GI bleeding. 4. Normal contour and caliber of the abdominal aorta. No evidence of aneurysm, dissection, or other acute aortic pathology. Moderate aortic atherosclerosis. Aortic Atherosclerosis (ICD10-I70.0). Electronically Signed   By: Marolyn JONETTA Jaksch M.D.   On: 02/23/2024 20:53    Impression/Plan: Painless hematochezia likely due to diverticular bleeding. CT angio negative for active bleeding. Will plan for inpatient colonoscopy to be done 7/16 after 2 days of prep. Clear liquid diet. Supportive  care.    LOS: 1 day   [name]  02/24/2024, 10:31 AM  Questions please call [phone]

## 2024-02-25 DIAGNOSIS — K625 Hemorrhage of anus and rectum: Secondary | ICD-10-CM | POA: Diagnosis not present

## 2024-02-25 LAB — CBC WITH DIFFERENTIAL/PLATELET
Abs Immature Granulocytes: 0.07 K/uL (ref 0.00–0.07)
Basophils Absolute: 0.1 K/uL (ref 0.0–0.1)
Basophils Relative: 1 %
Eosinophils Absolute: 0 K/uL (ref 0.0–0.5)
Eosinophils Relative: 0 %
HCT: 33.5 % — ABNORMAL LOW (ref 36.0–46.0)
Hemoglobin: 11 g/dL — ABNORMAL LOW (ref 12.0–15.0)
Immature Granulocytes: 1 %
Lymphocytes Relative: 11 %
Lymphs Abs: 1.5 K/uL (ref 0.7–4.0)
MCH: 30.1 pg (ref 26.0–34.0)
MCHC: 32.8 g/dL (ref 30.0–36.0)
MCV: 91.5 fL (ref 80.0–100.0)
Monocytes Absolute: 2.3 K/uL — ABNORMAL HIGH (ref 0.1–1.0)
Monocytes Relative: 16 %
Neutro Abs: 10 K/uL — ABNORMAL HIGH (ref 1.7–7.7)
Neutrophils Relative %: 71 %
Platelets: 392 K/uL (ref 150–400)
RBC: 3.66 MIL/uL — ABNORMAL LOW (ref 3.87–5.11)
RDW: 12.6 % (ref 11.5–15.5)
WBC: 14 K/uL — ABNORMAL HIGH (ref 4.0–10.5)
nRBC: 0 % (ref 0.0–0.2)

## 2024-02-25 LAB — CBC
HCT: 36.2 % (ref 36.0–46.0)
Hemoglobin: 11.9 g/dL — ABNORMAL LOW (ref 12.0–15.0)
MCH: 29.9 pg (ref 26.0–34.0)
MCHC: 32.9 g/dL (ref 30.0–36.0)
MCV: 91 fL (ref 80.0–100.0)
Platelets: 447 K/uL — ABNORMAL HIGH (ref 150–400)
RBC: 3.98 MIL/uL (ref 3.87–5.11)
RDW: 12.7 % (ref 11.5–15.5)
WBC: 11.2 K/uL — ABNORMAL HIGH (ref 4.0–10.5)
nRBC: 0 % (ref 0.0–0.2)

## 2024-02-25 LAB — RENAL FUNCTION PANEL
Albumin: 2.4 g/dL — ABNORMAL LOW (ref 3.5–5.0)
Anion gap: 12 (ref 5–15)
BUN: 10 mg/dL (ref 8–23)
CO2: 25 mmol/L (ref 22–32)
Calcium: 8.6 mg/dL — ABNORMAL LOW (ref 8.9–10.3)
Chloride: 99 mmol/L (ref 98–111)
Creatinine, Ser: 1 mg/dL (ref 0.44–1.00)
GFR, Estimated: 58 mL/min — ABNORMAL LOW (ref >60)
Glucose, Bld: 119 mg/dL — ABNORMAL HIGH (ref 70–99)
Phosphorus: 2.4 mg/dL — ABNORMAL LOW (ref 2.5–4.6)
Potassium: 4.1 mmol/L (ref 3.5–5.1)
Sodium: 136 mmol/L (ref 135–145)

## 2024-02-25 LAB — MAGNESIUM: Magnesium: 1.3 mg/dL — ABNORMAL LOW (ref 1.7–2.4)

## 2024-02-25 MED ORDER — MAGNESIUM SULFATE 4 GM/100ML IV SOLN
4.0000 g | Freq: Once | INTRAVENOUS | Status: AC
  Administered 2024-02-25: 4 g via INTRAVENOUS
  Filled 2024-02-25: qty 100

## 2024-02-25 MED ORDER — POTASSIUM CHLORIDE 2 MEQ/ML IV SOLN
INTRAVENOUS | Status: DC
  Filled 2024-02-25 (×2): qty 1000

## 2024-02-25 MED ORDER — ONDANSETRON HCL 4 MG/2ML IJ SOLN
4.0000 mg | Freq: Four times a day (QID) | INTRAMUSCULAR | Status: DC | PRN
  Administered 2024-02-25: 4 mg via INTRAVENOUS
  Filled 2024-02-25 (×2): qty 2

## 2024-02-25 MED ORDER — SODIUM CHLORIDE 0.9% FLUSH: 10.0000 mL | INTRAVENOUS | Status: DC | PRN

## 2024-02-25 MED ORDER — SODIUM CHLORIDE 0.9% FLUSH
10.0000 mL | Freq: Two times a day (BID) | INTRAVENOUS | Status: DC
  Administered 2024-02-25 (×2): 20 mL
  Administered 2024-02-26: 10 mL
  Administered 2024-02-26: 20 mL
  Administered 2024-02-27: 10 mL
  Administered 2024-02-27: 20 mL
  Administered 2024-02-27: 10 mL

## 2024-02-25 NOTE — Anesthesia Preprocedure Evaluation (Signed)
 Anesthesia Evaluation  Patient identified by MRN, date of birth, ID band Patient awake    Reviewed: Allergy & Precautions, NPO status , Patient's Chart, lab work & pertinent test results  History of Anesthesia Complications Negative for: history of anesthetic complications  Airway Mallampati: I  TM Distance: >3 FB Neck ROM: Full    Dental  (+) Dental Advisory Given   Pulmonary neg shortness of breath, asthma (childhood, no inhaler use) , neg sleep apnea, neg COPD, neg recent URI, Current Smoker and Patient abstained from smoking.   Pulmonary exam normal breath sounds clear to auscultation       Cardiovascular hypertension (losartan -HCTZ), Pt. on medications (-) angina (-) Past MI and (-) Cardiac Stents (-) dysrhythmias  Rhythm:Regular Rate:Normal  HLD   Neuro/Psych  Headaches, neg Seizures PSYCHIATRIC DISORDERS  Depression       GI/Hepatic Neg liver ROS, Bowel prep,,,Diverticulitis    Endo/Other  neg diabetesHypothyroidism    Renal/GU negative Renal ROS     Musculoskeletal  (+) Arthritis ,    Abdominal   Peds  Hematology  (+) Blood dyscrasia, anemia Lab Results      Component                Value               Date                      WBC                      9.3                 02/26/2024                HGB                      11.1 (L)            02/26/2024                HCT                      33.7 (L)            02/26/2024                MCV                      91.3                02/26/2024                PLT                      414 (H)             02/26/2024                   Anesthesia Other Findings   Reproductive/Obstetrics                              Anesthesia Physical Anesthesia Plan  ASA: 2  Anesthesia Plan: MAC   Post-op Pain Management: Minimal or no pain anticipated   Induction: Intravenous  PONV Risk Score and Plan: 1 and Propofol  infusion, TIVA and  Treatment may vary due to age or medical condition  Airway  Management Planned: Natural Airway and Nasal Cannula  Additional Equipment:   Intra-op Plan:   Post-operative Plan:   Informed Consent: I have reviewed the patients History and Physical, chart, labs and discussed the procedure including the risks, benefits and alternatives for the proposed anesthesia with the patient or authorized representative who has indicated his/her understanding and acceptance.     Dental advisory given  Plan Discussed with: Anesthesiologist and CRNA  Anesthesia Plan Comments: (Discussed with patient risks of MAC including, but not limited to, minor pain or discomfort, hearing people in the room, and possible need for backup general anesthesia. Risks for general anesthesia also discussed including, but not limited to, sore throat, hoarse voice, chipped/damaged teeth, injury to vocal cords, nausea and vomiting, allergic reactions, lung infection, heart attack, stroke, and death. All questions answered. )         Anesthesia Quick Evaluation

## 2024-02-25 NOTE — Progress Notes (Signed)
 PROGRESS NOTE    Darlene Sanders  FMW:[id number] DOB: Jan 20, 1946 DOA: 02/23/2024 PCP: Aisha Harvey, MD  Outpatient Specialists:     Brief Narrative:  Patient is a 78 year old female with past medical history significant for diverticulosis, hypertension, hyperlipidemia and hypothyroidism.  Patient presented with watery diarrhea and bright red blood per rectum.  No associated fever or chills.  No abdominal pain, nausea or vomiting.  Patient is known to Dr. Rosalie, GI physician.  GI team had planned to proceed with colonoscopy around the later part of the month.  Patient also reported feeling very weak, likely from volume depletion.  Patient has had chronic and intermittent hyponatremia.  BMP done on presentation revealed sodium of 131, potassium of 3.3, chloride of 99, CO2 of 22, BUN of 23, serum creatinine of 1.35 (baseline serum creatinine of 0.58), magnesium  of 1.9, albumin of 2.9, WBC of 11.2 and hemoglobin of 12.7 (baseline hemoglobin of 14.6 g/dL).  Vital signs reviewed revealed intermittent low blood pressure and mild tachycardia (but responded to volume resuscitation).  CT angio GI bleed revealed: 1. Descending and sigmoid diverticulosis. Fat stranding of the sigmoid colon with a prominent diverticulum or ulcerated outpouching at the superior aspect of the mid sigmoid colon measuring 3.2 x 3.0 cm. There appears to be a small, linear radiopaque foreign body within the wall and adjacent mesenteric fat of the fundus of the outpouching measuring 1.0 cm in length. This is possibly this chronic sequelae of a diverticular abscess which has retained an ingested foreign body. 2. Severe circumferential wall thickening of the rectum with extensive perirectal and presacral fat stranding, generally most consistent with nonspecific infectious or inflammatory proctitis but rectal mass is distinctly not excluded given this appearance. 3. No intraluminal contrast extravasation or other findings  to specifically localize nidus of GI bleeding. 4. Normal contour and caliber of the abdominal aorta. No evidence of aneurysm, dissection, or other acute aortic pathology. Moderate aortic atherosclerosis.   Aortic Atherosclerosis  02/24/2024: Patient seen.  Patient continues to have diarrhea and rectal bleeding.  Awaiting GI input.  Patient is currently on IV Rocephin  and Flagyl .   Assessment & Plan:   Principal Problem:   Rectal bleeding   Acute diverticular lower GI bleed:  - [facility] following, being managed with conservative management with bowel rest, antibiotics and close monitoring of CBC, H&H currently stable, type screen done patient agreeable for transfusion if needed.   Acute blood loss anemia Symptomatic anemia -See above documentation.   -     Hyponatremia with Sanders improved after IV fluid  Hypokalemia, likely from GI losses secondary to frequent stools Replaced   Chronic anxiety/depression Resume home regimen.   DVT prophylaxis: SCD Code Status: Full code. Family Communication:  Disposition Plan: Inpatient.     Consultants:  [facility].  Procedures:  CT scan - 1. Descending and sigmoid diverticulosis. Fat stranding of the sigmoid colon with a prominent diverticulum or ulcerated outpouching at the superior aspect of the mid sigmoid colon measuring 3.2 x 3.0 cm. There appears to be a small, linear radiopaque foreign body within the wall and adjacent mesenteric fat of the fundus of the outpouching measuring 1.0 cm in length. This is possibly this chronic sequelae of a diverticular abscess which has retained an ingested foreign body. 2. Severe circumferential wall thickening of the rectum with extensive perirectal and presacral fat stranding, generally most consistent with nonspecific infectious or inflammatory proctitis but rectal mass is distinctly not excluded given this appearance. 3. No intraluminal  contrast extravasation or other findings to specifically  localize nidus of GI bleeding. 4. Normal contour and caliber of the abdominal aorta. No evidence of aneurysm, dissection, or other acute aortic pathology. Moderate aortic atherosclerosis. Aortic Atherosclerosis   Antimicrobials:  IV Rocephin . IV Flagyl .   Subjective:  Patient in bed, appears comfortable, denies any headache, no fever, no chest pain or pressure, no shortness of breath , no abdominal pain, still had some blood in her stool this morning on 02/25/2024. No new focal weakness.   Objective: Vitals:   02/25/24 0200 02/25/24 0400 02/25/24 0413 02/25/24 0902  BP:   119/73 107/66  Pulse:  91 98   Resp: 20 20 20    Temp:   98.4 F (36.9 C) 98.3 F (36.8 C)  TempSrc:   Oral Oral  SpO2: 97% 94% 98%   Weight:      Height:        Intake/Output Summary (Last 24 hours) at 02/25/2024 0949 Last data filed at 02/25/2024 0459 Gross per 24 hour  Intake 2235.74 ml  Output --  Net 2235.74 ml   Filed Weights   02/23/24 1937  Weight: 61.2 kg    Examination:  Awake Alert, No new F.N deficits, Normal affect [place].AT,PERRAL Supple Neck, No JVD,   Symmetrical Chest wall movement, Good air movement bilaterally, CTAB RRR,No Gallops, Rubs or new Murmurs,  +ve B.Sounds, Abd Soft, No tenderness,   No Cyanosis, Clubbing or edema    Data Reviewed: I have personally reviewed following labs and imaging studies   Data Review:   Inpatient Medications  Scheduled Meds:  cholecalciferol   2,000 Units Oral QODAY   cyanocobalamin   500 mcg Oral Daily   escitalopram   15 mg Oral Daily   levothyroxine   88 mcg Oral QAC breakfast   polyethylene glycol-electrolytes  4,000 mL Oral Once   sodium chloride  flush  10-40 mL Intracatheter Q12H   Continuous Infusions:  sodium chloride  20 mL/hr at 02/25/24 0102   cefTRIAXone  (ROCEPHIN )  IV Stopped (02/24/24 2334)   metronidazole  Stopped (02/24/24 2353)   PRN Meds:.sodium chloride  flush  DVT Prophylaxis  SCDs Start: 02/23/24 2238   Recent Labs   Lab 02/23/24 1944 02/23/24 1958 02/24/24 0418 02/25/24 0410  WBC 11.2*  --   --  14.0*  HGB 12.7 12.2 11.7* 11.0*  HCT 39.4 36.0 36.0 33.5*  PLT 409*  --   --  392  MCV 91.6  --   --  91.5  MCH 29.5  --   --  30.1  MCHC 32.2  --   --  32.8  RDW 12.7  --   --  12.6  LYMPHSABS 1.4  --   --  1.5  MONOABS 1.6*  --   --  2.3*  EOSABS 0.1  --   --  0.0  BASOSABS 0.1  --   --  0.1    Recent Labs  Lab 02/23/24 1944 02/23/24 1958 02/24/24 0418 02/25/24 0410  NA 131* 132* 134* 136  K 3.3* 3.2* 3.5 4.1  CL 99 95* 99 99  CO2 22  --  25 25  ANIONGAP 10  --  10 12  GLUCOSE 112* 112* 108* 119*  BUN 23 25* 18 10  CREATININE 1.35* 1.40* 1.19* 1.00  AST 21  --   --   --   ALT 12  --   --   --   ALKPHOS 46  --   --   --   BILITOT 0.7  --   --   --  ALBUMIN 2.9*  --  2.7* 2.4*  LATICACIDVEN  --  1.7  --   --   INR 1.1  --   --   --   MG  --   --  1.9 1.3*  PHOS  --   --  2.5 2.4*  CALCIUM  9.1  --  8.8* 8.6*      Recent Labs  Lab 02/23/24 1944 02/23/24 1958 02/24/24 0418 02/25/24 0410  LATICACIDVEN  --  1.7  --   --   INR 1.1  --   --   --   MG  --   --  1.9 1.3*  CALCIUM  9.1  --  8.8* 8.6*    --------------------------------------------------------------------------------------------------------------- No results found for: CHOL, HDL, LDLCALC, LDLDIRECT, TRIG, CHOLHDL  No results found for: HGBA1C No results for input(s): TSH, T4TOTAL, FREET4, T3FREE, THYROIDAB in the last 72 hours. No results for input(s): VITAMINB12, FOLATE, FERRITIN, TIBC, IRON, RETICCTPCT in the last 72 hours. ------------------------------------------------------------------------------------------------------------------ Cardiac Enzymes No results for input(s): CKMB, TROPONINI, MYOGLOBIN in the last 168 hours.  Invalid input(s): CK  Micro Results No results found for this or any previous visit (from the past 240 hours).  Radiology Reports  CT  ANGIO GI BLEED Result Date: 02/23/2024 CLINICAL DATA:  GI bleed * Tracking Code: BO * EXAM: CTA ABDOMEN AND PELVIS WITHOUT AND WITH CONTRAST TECHNIQUE: Multidetector CT imaging of the abdomen and pelvis was performed using the standard protocol during bolus administration of intravenous contrast. Multiplanar reconstructed images and MIPs were obtained and reviewed to evaluate the vascular anatomy. RADIATION DOSE REDUCTION: This exam was performed according to the departmental dose-optimization program which includes automated exposure control, adjustment of the mA and/or kV according to patient size and/or use of iterative reconstruction technique. CONTRAST:  OMNIPAQUE  IOHEXOL  350 MG/ML SOLN COMPARISON:  None Available. FINDINGS: VASCULAR Normal contour and caliber of the abdominal aorta. No evidence of aneurysm, dissection, or other acute aortic pathology. Standard branching pattern of the abdominal aorta with solitary bilateral renal arteries. Moderate aortic atherosclerosis. Review of the MIP images confirms the above findings. NON-VASCULAR Lower Chest: No acute findings.  Small hiatal hernia. Hepatobiliary: No solid liver abnormality is seen. No gallstones, gallbladder wall thickening, or biliary dilatation. Pancreas: Unremarkable. No pancreatic ductal dilatation or surrounding inflammatory changes. Spleen: Normal in size without significant abnormality. Adrenals/Urinary Tract: Adrenal glands are unremarkable. Simple, benign bilateral renal cortical cysts, for which no further follow-up or characterization is required. Kidneys are otherwise normal, without renal calculi, solid lesion, or hydronephrosis. Bladder is unremarkable. Stomach/Bowel: Stomach is within normal limits. Appendix appears normal. Descending and sigmoid diverticulosis. Fat stranding of the sigmoid colon with a prominent diverticulum or ulcerated outpouching at the superior aspect of the mid sigmoid colon measuring 3.2 x 3.0 cm (series  16, image 66). There appears to be a small, linear radiopaque foreign body within the wall and adjacent mesenteric fat of the fundus of the outpouching measuring 1.0 cm in length. Severe circumferential wall thickening of the rectum with extensive perirectal and presacral fat stranding (series 9, image 73). Lymphatic: No enlarged abdominal or pelvic lymph nodes. Reproductive: Fibroid of the uterine fundus (series 9, image 64). Other: Ventral hernia mesh repair.  No ascites. Musculoskeletal: No acute osseous findings. Status post right hip total arthroplasty. IMPRESSION: 1. Descending and sigmoid diverticulosis. Fat stranding of the sigmoid colon with a prominent diverticulum or ulcerated outpouching at the superior aspect of the mid sigmoid colon measuring 3.2 x 3.0 cm. There appears to  be a small, linear radiopaque foreign body within the wall and adjacent mesenteric fat of the fundus of the outpouching measuring 1.0 cm in length. This is possibly this chronic sequelae of a diverticular abscess which has retained an ingested foreign body. 2. Severe circumferential wall thickening of the rectum with extensive perirectal and presacral fat stranding, generally most consistent with nonspecific infectious or inflammatory proctitis but rectal mass is distinctly not excluded given this appearance. 3. No intraluminal contrast extravasation or other findings to specifically localize nidus of GI bleeding. 4. Normal contour and caliber of the abdominal aorta. No evidence of aneurysm, dissection, or other acute aortic pathology. Moderate aortic atherosclerosis. Aortic Atherosclerosis (ICD10-I70.0). Electronically Signed   By: Marolyn JONETTA Jaksch M.D.   On: 02/23/2024 20:53      Signature  -   Lavada Stank M.D on 02/25/2024 at 9:49 AM   -  To page go to [email or web address]

## 2024-02-25 NOTE — Plan of Care (Signed)

## 2024-02-25 NOTE — H&P (View-Only) (Signed)
 [facility] Gastroenterology Progress Note  CAELA HUOT 78 y.o. July 20, 1946   Subjective: Drinking prep. Denies rectal bleeding or abdominal pain.  Objective: Vital signs: Vitals:   02/25/24 0902 02/25/24 1212  BP: 107/66 109/66  Pulse:  74  Resp:  (!) 23  Temp: 98.3 F (36.8 C) 97.9 F (36.6 C)  SpO2:  91%    Physical Exam: Gen: lethargic, elderly, no acute distress, pleasant HEENT: anicteric sclera CV: RRR Chest: CTA B Abd: soft, nontender, nondistended, +BS Ext: no edema  Lab Results: Recent Labs    02/24/24 0418 02/25/24 0410  NA 134* 136  K 3.5 4.1  CL 99 99  CO2 25 25  GLUCOSE 108* 119*  BUN 18 10  CREATININE 1.19* 1.00  CALCIUM  8.8* 8.6*  MG 1.9 1.3*  PHOS 2.5 2.4*   Recent Labs    02/23/24 1944 02/24/24 0418 02/25/24 0410  AST 21  --   --   ALT 12  --   --   ALKPHOS 46  --   --   BILITOT 0.7  --   --   PROT 6.2*  --   --   ALBUMIN 2.9* 2.7* 2.4*   Recent Labs    02/23/24 1944 02/23/24 1958 02/24/24 0418 02/25/24 0410  WBC 11.2*  --   --  14.0*  NEUTROABS 7.9*  --   --  10.0*  HGB 12.7   < > 11.7* 11.0*  HCT 39.4   < > 36.0 33.5*  MCV 91.6  --   --  91.5  PLT 409*  --   --  392   < > = values in this interval not displayed.      Assessment/Plan: Painless hematochezia - resolving. On day 2 of colon prep. Colonoscopy tomorrow morning. Clear liquid diet. NPO p MN. Supportive care.    [name] 02/25/2024, 3:48 PM  Questions please call [phone] ID: Sonny DELENA Moats, female   DOB: 11/01/1945, 78 y.o.   MRN: [id number]

## 2024-02-25 NOTE — Plan of Care (Signed)

## 2024-02-25 NOTE — Progress Notes (Signed)
 [facility]enterology Progress Note  Darlene Sanders 78 y.o. July 20, 1946   Subjective: Drinking prep. Denies rectal bleeding or abdominal pain.  Objective: Vital signs: Vitals:   02/25/24 0902 02/25/24 1212  BP: 107/66 109/66  Pulse:  74  Resp:  (!) 23  Temp: 98.3 F (36.8 C) 97.9 F (36.6 C)  SpO2:  91%    Physical Exam: Gen: lethargic, elderly, no acute distress, pleasant HEENT: anicteric sclera CV: RRR Chest: CTA B Abd: soft, nontender, nondistended, +BS Ext: no edema  Lab Results: Recent Labs    02/24/24 0418 02/25/24 0410  NA 134* 136  K 3.5 4.1  CL 99 99  CO2 25 25  GLUCOSE 108* 119*  BUN 18 10  CREATININE 1.19* 1.00  CALCIUM  8.8* 8.6*  MG 1.9 1.3*  PHOS 2.5 2.4*   Recent Labs    02/23/24 1944 02/24/24 0418 02/25/24 0410  AST 21  --   --   ALT 12  --   --   ALKPHOS 46  --   --   BILITOT 0.7  --   --   PROT 6.2*  --   --   ALBUMIN 2.9* 2.7* 2.4*   Recent Labs    02/23/24 1944 02/23/24 1958 02/24/24 0418 02/25/24 0410  WBC 11.2*  --   --  14.0*  NEUTROABS 7.9*  --   --  10.0*  HGB 12.7   < > 11.7* 11.0*  HCT 39.4   < > 36.0 33.5*  MCV 91.6  --   --  91.5  PLT 409*  --   --  392   < > = values in this interval not displayed.      Assessment/Plan: Painless hematochezia - resolving. On day 2 of colon prep. Colonoscopy tomorrow morning. Clear liquid diet. NPO p MN. Supportive care.    Darlene Sanders Sol 02/25/2024, 3:48 PM  Questions please call [phone] ID: Darlene Sanders, female   DOB: 11/01/1945, 78 y.o.   MRN: [id number]

## 2024-02-26 ENCOUNTER — Encounter (HOSPITAL_COMMUNITY): Payer: Self-pay | Admitting: Internal Medicine

## 2024-02-26 ENCOUNTER — Encounter (HOSPITAL_COMMUNITY)
Admission: EM | Disposition: A | Discharge status: Home / Self Care | Payer: Self-pay | Source: Ambulatory Visit | Attending: Internal Medicine

## 2024-02-26 ENCOUNTER — Inpatient Hospital Stay (HOSPITAL_COMMUNITY): Payer: Self-pay | Admitting: Anesthesiology

## 2024-02-26 DIAGNOSIS — F17210 Nicotine dependence, cigarettes, uncomplicated: Secondary | ICD-10-CM

## 2024-02-26 DIAGNOSIS — K5730 Diverticulosis of large intestine without perforation or abscess without bleeding: Secondary | ICD-10-CM | POA: Diagnosis not present

## 2024-02-26 DIAGNOSIS — I10 Essential (primary) hypertension: Secondary | ICD-10-CM

## 2024-02-26 DIAGNOSIS — K920 Hematemesis: Secondary | ICD-10-CM

## 2024-02-26 DIAGNOSIS — K625 Hemorrhage of anus and rectum: Secondary | ICD-10-CM | POA: Diagnosis not present

## 2024-02-26 DIAGNOSIS — K640 First degree hemorrhoids: Secondary | ICD-10-CM

## 2024-02-26 HISTORY — PX: COLONOSCOPY: SHX5424

## 2024-02-26 LAB — CBC WITH DIFFERENTIAL/PLATELET
Abs Immature Granulocytes: 0.07 K/uL (ref 0.00–0.07)
Abs Immature Granulocytes: 0.08 K/uL — ABNORMAL HIGH (ref 0.00–0.07)
Basophils Absolute: 0.1 K/uL (ref 0.0–0.1)
Basophils Absolute: 0.1 K/uL (ref 0.0–0.1)
Basophils Relative: 1 %
Basophils Relative: 1 %
Eosinophils Absolute: 0.2 K/uL (ref 0.0–0.5)
Eosinophils Absolute: 0.2 K/uL (ref 0.0–0.5)
Eosinophils Relative: 2 %
Eosinophils Relative: 2 %
HCT: 33.7 % — ABNORMAL LOW (ref 36.0–46.0)
HCT: 31.4 % — ABNORMAL LOW (ref 36.0–46.0)
Hemoglobin: 11.1 g/dL — ABNORMAL LOW (ref 12.0–15.0)
Hemoglobin: 10.2 g/dL — ABNORMAL LOW (ref 12.0–15.0)
Immature Granulocytes: 1 %
Immature Granulocytes: 1 %
Lymphocytes Relative: 15 %
Lymphocytes Relative: 15 %
Lymphs Abs: 1.4 K/uL (ref 0.7–4.0)
Lymphs Abs: 1.3 K/uL (ref 0.7–4.0)
MCH: 30.1 pg (ref 26.0–34.0)
MCH: 29.6 pg (ref 26.0–34.0)
MCHC: 32.9 g/dL (ref 30.0–36.0)
MCHC: 32.5 g/dL (ref 30.0–36.0)
MCV: 91.3 fL (ref 80.0–100.0)
MCV: 91 fL (ref 80.0–100.0)
Monocytes Absolute: 1.3 K/uL — ABNORMAL HIGH (ref 0.1–1.0)
Monocytes Absolute: 1.2 K/uL — ABNORMAL HIGH (ref 0.1–1.0)
Monocytes Relative: 14 %
Monocytes Relative: 14 %
Neutro Abs: 6.3 K/uL (ref 1.7–7.7)
Neutro Abs: 5.9 K/uL (ref 1.7–7.7)
Neutrophils Relative %: 67 %
Neutrophils Relative %: 67 %
Platelets: 414 K/uL — ABNORMAL HIGH (ref 150–400)
Platelets: 430 K/uL — ABNORMAL HIGH (ref 150–400)
RBC: 3.69 MIL/uL — ABNORMAL LOW (ref 3.87–5.11)
RBC: 3.45 MIL/uL — ABNORMAL LOW (ref 3.87–5.11)
RDW: 12.6 % (ref 11.5–15.5)
RDW: 12.8 % (ref 11.5–15.5)
WBC: 9.3 K/uL (ref 4.0–10.5)
WBC: 8.7 K/uL (ref 4.0–10.5)
nRBC: 0 % (ref 0.0–0.2)
nRBC: 0 % (ref 0.0–0.2)

## 2024-02-26 LAB — BASIC METABOLIC PANEL WITH GFR
Anion gap: 14 (ref 5–15)
BUN: 6 mg/dL — ABNORMAL LOW (ref 8–23)
CO2: 23 mmol/L (ref 22–32)
Calcium: 8.6 mg/dL — ABNORMAL LOW (ref 8.9–10.3)
Chloride: 99 mmol/L (ref 98–111)
Creatinine, Ser: 0.94 mg/dL (ref 0.44–1.00)
GFR, Estimated: >60 mL/min (ref >60)
Glucose, Bld: 92 mg/dL (ref 70–99)
Potassium: 3.5 mmol/L (ref 3.5–5.1)
Sodium: 136 mmol/L (ref 135–145)

## 2024-02-26 LAB — MAGNESIUM: Magnesium: 1.9 mg/dL (ref 1.7–2.4)

## 2024-02-26 SURGERY — COLONOSCOPY
Anesthesia: Monitor Anesthesia Care

## 2024-02-26 MED ORDER — POTASSIUM CHLORIDE 2 MEQ/ML IV SOLN
INTRAVENOUS | Status: DC
  Filled 2024-02-26 (×2): qty 1000

## 2024-02-26 MED ORDER — PROPOFOL 500 MG/50ML IV EMUL
INTRAVENOUS | Status: DC | PRN
  Administered 2024-02-26: 115 ug/kg/min via INTRAVENOUS

## 2024-02-26 MED ORDER — SODIUM CHLORIDE 0.9 % IV SOLN: INTRAVENOUS | Status: DC | PRN

## 2024-02-26 MED ORDER — PROPOFOL 10 MG/ML IV BOLUS
INTRAVENOUS | Status: DC | PRN
  Administered 2024-02-26: 50 mg via INTRAVENOUS

## 2024-02-26 MED ORDER — LIDOCAINE 2% (20 MG/ML) 5 ML SYRINGE
INTRAMUSCULAR | Status: DC | PRN
  Administered 2024-02-26: 60 mg via INTRAVENOUS

## 2024-02-26 MED ORDER — PHENYLEPHRINE 80 MCG/ML (10ML) SYRINGE FOR IV PUSH (FOR BLOOD PRESSURE SUPPORT)
PREFILLED_SYRINGE | INTRAVENOUS | Status: DC | PRN
Start: 2024-02-26 — End: 2024-02-26
  Administered 2024-02-26: 160 ug via INTRAVENOUS
  Administered 2024-02-26: 80 ug via INTRAVENOUS
  Administered 2024-02-26: 160 ug via INTRAVENOUS

## 2024-02-26 MED ORDER — POTASSIUM CHLORIDE CRYS ER 20 MEQ PO TBCR
40.0000 meq | EXTENDED_RELEASE_TABLET | Freq: Once | ORAL | Status: AC
  Administered 2024-02-26: 40 meq via ORAL
  Filled 2024-02-26: qty 2

## 2024-02-26 NOTE — Plan of Care (Signed)
  Problem: Education: Goal: Knowledge of General Education information will improve Description: Including pain rating scale, medication(s)/side effects and non-pharmacologic comfort measures Outcome: Progressing   Problem: Health Behavior/Discharge Planning: Goal: Ability to manage health-related needs will improve Outcome: Progressing   Problem: Clinical Measurements: Goal: Ability to maintain clinical measurements within normal limits will improve Outcome: Progressing Goal: Will remain free from infection Outcome: Progressing Goal: Diagnostic test results will improve Outcome: Progressing Goal: Respiratory complications will improve Outcome: Progressing Goal: Cardiovascular complication will be avoided Outcome: Progressing   Problem: Activity: Goal: Risk for activity intolerance will decrease Outcome: Progressing   Problem: Elimination: Goal: Will not experience complications related to urinary retention Outcome: Progressing   Problem: Pain Managment: Goal: General experience of comfort will improve and/or be controlled Outcome: Progressing   Problem: Safety: Goal: Ability to remain free from injury will improve Outcome: Progressing   Problem: Skin Integrity: Goal: Risk for impaired skin integrity will decrease Outcome: Progressing   Problem: Nutrition: Goal: Adequate nutrition will be maintained Outcome: Not Progressing   Problem: Coping: Goal: Level of anxiety will decrease Outcome: Not Progressing   Problem: Elimination: Goal: Will not experience complications related to bowel motility Outcome: Not Progressing

## 2024-02-26 NOTE — Care Management Important Message (Signed)
 Important Message  Patient Details  Name: Darlene Sanders MRN: [id number] Date of Birth: 03-23-1946   Important Message Given:  Yes - Medicare IM     Claretta Deed 02/26/2024, 11:21 AM

## 2024-02-26 NOTE — TOC CM/SW Note (Signed)
 Transition of Care [facility]) - Inpatient Brief Assessment   Patient Details  Name: Darlene Sanders MRN: [id number] Date of Birth: [birth year]  Transition of Care [facility]) CM/SW Contact:    Lauraine FORBES Saa, LCSW Phone Number: 02/26/2024, 4:13 PM   Clinical Narrative:  4:13 PM Per chart review, patient resides at home alone. Patient has a PCP and insurance. Patient does not have SNF or HH history. Patient was ordered a walker January 2018. Patient's preferred pharmacy is [facility] [phone] [facility]. No TOC needs were identified at this time. TOC will continue to follow and be available to assist.  Transition of Care Asessment: Insurance and Status: Insurance coverage has been reviewed Patient has primary care physician: Yes Home environment has been reviewed: Private Residence Prior level of function:: N/A Prior/Current Home Services: No current home services Social Drivers of Health Review: SDOH reviewed no interventions necessary Readmission risk has been reviewed: Yes Transition of care needs: no transition of care needs at this time

## 2024-02-26 NOTE — Anesthesia Postprocedure Evaluation (Signed)
 Anesthesia Post Note  Patient: Darlene Sanders  Procedure(s) Performed: COLONOSCOPY     Patient location during evaluation: PACU Anesthesia Type: MAC Level of consciousness: awake Pain management: pain level controlled Vital Signs Assessment: post-procedure vital signs reviewed and stable Respiratory status: spontaneous breathing, nonlabored ventilation and respiratory function stable Cardiovascular status: stable and blood pressure returned to baseline Postop Assessment: no apparent nausea or vomiting Anesthetic complications: no   No notable events documented.  Last Vitals:  Vitals:   02/26/24 0910 02/26/24 0920  BP: 107/65 107/75  Pulse: 75 77  Resp: 16 13  Temp:    SpO2: 98% 96%    Last Pain:  Vitals:   02/26/24 0920  TempSrc:   PainSc: 0-No pain                 Delon Aisha Arch

## 2024-02-26 NOTE — Op Note (Signed)
 [facility] Patient Name: Darlene Sanders Procedure Date : 02/26/2024 MRN: [id number] Attending MD: [name] , MD, [id number] Date of Birth: 07/12/1946 CSN: [id number] Age: 78 Admit Type: Inpatient Procedure:                Colonoscopy Indications:              Last colonoscopy: April 2013, Hematochezia Providers:                Jerrell KYM Sol, MD, Darleene Bare, RN, Coye Bade, Technician Referring MD:             hospital team Medicines:                Propofol  per Anesthesia, Monitored Anesthesia Care Complications:            No immediate complications. Estimated Blood Loss:     Estimated blood loss was minimal. Procedure:                Pre-Anesthesia Assessment:                           - Prior to the procedure, a History and Physical                            was performed, and patient medications and                            allergies were reviewed. The patient's tolerance of                            previous anesthesia was also reviewed. The risks                            and benefits of the procedure and the sedation                            options and risks were discussed with the patient.                            All questions were answered, and informed consent                            was obtained. Prior Anticoagulants: The patient has                            taken no anticoagulant or antiplatelet agents. ASA                            Grade Assessment: II - A patient with mild systemic                            disease. After reviewing the risks and benefits,  the patient was deemed in satisfactory condition to                            undergo the procedure.                           After obtaining informed consent, the colonoscope                            was passed under direct vision. Throughout the                            procedure, the patient's blood  pressure, pulse, and                            oxygen saturations were monitored continuously. The                            [id number]-HQ190L ([id number]) Olympus colonoscope was                            introduced through the anus and advanced to the the                            cecum, identified by appendiceal orifice and                            ileocecal valve. The colonoscopy was somewhat                            difficult due to severely inflamed distal colonic                            segment. Successful completion of the procedure was                            aided by performing the maneuvers documented                            (below) in this report. The patient tolerated the                            procedure well. The quality of the bowel                            preparation was fair. The terminal ileum, ileocecal                            valve, appendiceal orifice, and rectum were                            photographed. Scope In: 8:38:29 AM Scope Out: 8:54:16 AM Scope Withdrawal Time: 0 hours 11 minutes 32 seconds  Total Procedure Duration: 0 hours 15 minutes 47 seconds  Findings:  The perianal and digital rectal examinations were normal.      A diffuse area of severely congested, erythematous, eroded, inflamed,       ulcerated and vascular-pattern-decreased mucosa was found in the rectum,       in the recto-sigmoid colon, in the sigmoid colon and from 0 to 30 cm       proximal to the anus. Biopsies were taken with a cold forceps for       histology. Estimated blood loss was minimal.      Internal hemorrhoids were found during endoscopy. The hemorrhoids were       medium-sized and Grade I (internal hemorrhoids that do not prolapse).      Multiple medium-mouthed and small-mouthed diverticula were found in the       sigmoid colon and descending colon.      The terminal ileum appeared normal.      A localized area of mildly congested, inflamed and nodular  mucosa was       found in the descending colon. Impression:               - Preparation of the colon was fair.                           - Congested, erythematous, eroded, inflamed,                            ulcerated and vascular-pattern-decreased mucosa in                            the rectum, in the recto-sigmoid colon, in the                            sigmoid colon and from 0 to 30 cm proximal to the                            anus. Biopsied.                           - Internal hemorrhoids.                           - Diverticulosis in the sigmoid colon and in the                            descending colon.                           - The examined portion of the ileum was normal.                           - Congested, inflamed and nodular mucosa in the                            descending colon. Recommendation:           - Await pathology results.                           -  Repeat colonoscopy for surveillance based on                            pathology results.                           - Advance diet as tolerated and clear liquid diet. Procedure Code(s):        --- Professional ---                           [phone], Colonoscopy, flexible; with biopsy, single                            or multiple Diagnosis Code(s):        --- Professional ---                           K92.1, Melena (includes Hematochezia)                           K63.3, Ulcer of intestine                           K62.6, Ulcer of anus and rectum                           K62.89, Other specified diseases of anus and rectum                           K52.9, Noninfective gastroenteritis and colitis,                            unspecified                           K63.89, Other specified diseases of intestine                           K64.0, First degree hemorrhoids                           K57.30, Diverticulosis of large intestine without                            perforation or abscess without bleeding CPT  copyright 2022 American Medical Association. All rights reserved. The codes documented in this report are preliminary and upon coder review may  be revised to meet current compliance requirements. [name], MD 02/26/2024 9:10:26 AM This report has been signed electronically. Number of Addenda: 0

## 2024-02-26 NOTE — Progress Notes (Addendum)
 PROGRESS NOTE    Darlene Sanders  FMW:[id number] DOB: 1946/06/26 DOA: 02/23/2024 PCP: Aisha Harvey, MD  Outpatient Specialists:     Brief Narrative:  Patient is a 78 year old female with past medical history significant for diverticulosis, hypertension, hyperlipidemia and hypothyroidism.  Patient presented with watery diarrhea and bright red blood per rectum.  No associated fever or chills.  No abdominal pain, nausea or vomiting.  Patient is known to Dr. Rosalie, GI physician.  GI team had planned to proceed with colonoscopy around the later part of the month.  Patient also reported feeling very weak, likely from volume depletion.  Patient has had chronic and intermittent hyponatremia.  BMP done on presentation revealed sodium of 131, potassium of 3.3, chloride of 99, CO2 of 22, BUN of 23, serum creatinine of 1.35 (baseline serum creatinine of 0.58), magnesium  of 1.9, albumin of 2.9, WBC of 11.2 and hemoglobin of 12.7 (baseline hemoglobin of 14.6 g/dL).  Vital signs reviewed revealed intermittent low blood pressure and mild tachycardia (but responded to volume resuscitation).  CT angio GI bleed revealed: 1. Descending and sigmoid diverticulosis. Fat stranding of the sigmoid colon with a prominent diverticulum or ulcerated outpouching at the superior aspect of the mid sigmoid colon measuring 3.2 x 3.0 cm. There appears to be a small, linear radiopaque foreign body within the wall and adjacent mesenteric fat of the fundus of the outpouching measuring 1.0 cm in length. This is possibly this chronic sequelae of a diverticular abscess which has retained an ingested foreign body. 2. Severe circumferential wall thickening of the rectum with extensive perirectal and presacral fat stranding, generally most consistent with nonspecific infectious or inflammatory proctitis but rectal mass is distinctly not excluded given this appearance. 3. No intraluminal contrast extravasation or other findings  to specifically localize nidus of GI bleeding. 4. Normal contour and caliber of the abdominal aorta. No evidence of aneurysm, dissection, or other acute aortic pathology. Moderate aortic atherosclerosis.   Aortic Atherosclerosis  02/24/2024: Patient seen.  Patient continues to have diarrhea and rectal bleeding.  Awaiting GI input.  Patient is currently on IV Rocephin  and Flagyl .   Assessment & Plan:   Principal Problem:   Rectal bleeding   Acute diverticular lower GI bleed:  - [facility] following, being managed with conservative management with bowel rest, antibiotics and close monitoring of CBC, H&H currently stable, type screen done patient agreeable for transfusion if needed.  Colonoscopy done on 02/26/2024, rectosigmoid ulceration noted, continue to monitor closely.  Still at risk for bleeding.  Follow-up biopsy results.   Acute blood loss anemia Symptomatic anemia -See above documentation.   -     Hyponatremia with AKI improved after IV fluid  Hypokalemia, likely from GI losses secondary to frequent stools Replaced   Chronic anxiety/depression Resume home regimen.   DVT prophylaxis: SCD Code Status: Full code. Family Communication:  Disposition Plan: Inpatient.     Consultants:  [facility].  Procedures:  CT scan - 1. Descending and sigmoid diverticulosis. Fat stranding of the sigmoid colon with a prominent diverticulum or ulcerated outpouching at the superior aspect of the mid sigmoid colon measuring 3.2 x 3.0 cm. There appears to be a small, linear radiopaque foreign body within the wall and adjacent mesenteric fat of the fundus of the outpouching measuring 1.0 cm in length. This is possibly this chronic sequelae of a diverticular abscess which has retained an ingested foreign body. 2. Severe circumferential wall thickening of the rectum with extensive perirectal and presacral fat stranding,  generally most consistent with nonspecific infectious or inflammatory proctitis  but rectal mass is distinctly not excluded given this appearance. 3. No intraluminal contrast extravasation or other findings to specifically localize nidus of GI bleeding. 4. Normal contour and caliber of the abdominal aorta. No evidence of aneurysm, dissection, or other acute aortic pathology. Moderate aortic atherosclerosis. Aortic Atherosclerosis   Colonoscopy by Dr. Jerrell Sol on 02/26/2024.  Impression:               - Preparation of the colon was fair.                           - Congested, erythematous, eroded, inflamed,                            ulcerated and vascular-pattern-decreased mucosa in                            the rectum, in the recto-sigmoid colon, in the                            sigmoid colon and from 0 to 30 cm proximal to the                            anus. Biopsied.                           - Internal hemorrhoids.                           - Diverticulosis in the sigmoid colon and in the                            descending colon.                           - The examined portion of the ileum was normal.                           - Congested, inflamed and nodular mucosa in the                            descending colon. Recommendation:           - Await pathology results.                           - Repeat colonoscopy for surveillance based on                            pathology results.                           - Advance diet as tolerated and clear liquid diet.    Antimicrobials:  IV Rocephin . IV Flagyl .   Subjective:  Patient in bed, appears comfortable, denies any headache, no fever, no chest pain or pressure, no shortness of breath , no abdominal pain. No focal weakness.  Objective: Vitals:   02/26/24 0901 02/26/24 0904 02/26/24 0910 02/26/24 0920  BP: (!) 84/47 115/78 107/65 107/75  Pulse: (!) 59 75 75 77  Resp: 12 (!) 23 16 13   Temp: (!) 97.1 F (36.2 C)     TempSrc: Temporal     SpO2: 99% 98% 98% 96%  Weight:      Height:         Intake/Output Summary (Last 24 hours) at 02/26/2024 0945 Last data filed at 02/26/2024 0902 Gross per 24 hour  Intake 1350 ml  Output --  Net 1350 ml   Filed Weights   02/23/24 1937  Weight: 61.2 kg    Examination:  Awake Alert, No new F.N deficits, Normal affect [place].AT,PERRAL Supple Neck, No JVD,   Symmetrical Chest wall movement, Good air movement bilaterally, CTAB RRR,No Gallops, Rubs or new Murmurs,  +ve B.Sounds, Abd Soft, No tenderness,   No Cyanosis, Clubbing or edema    Data Reviewed: I have personally reviewed following labs and imaging studies   Data Review:   Inpatient Medications  Scheduled Meds:  cholecalciferol   2,000 Units Oral QODAY   cyanocobalamin   500 mcg Oral Daily   escitalopram   15 mg Oral Daily   levothyroxine   88 mcg Oral QAC breakfast   sodium chloride  flush  10-40 mL Intracatheter Q12H   Continuous Infusions:  cefTRIAXone  (ROCEPHIN )  IV 2 g (02/26/24 0007)   lactated ringers  1,000 mL with potassium chloride  20 mEq infusion 50 mL/hr at 02/26/24 0703   metronidazole  500 mg (02/26/24 0114)   PRN Meds:.ondansetron  (ZOFRAN ) IV  DVT Prophylaxis  SCDs Start: 02/23/24 2238   Recent Labs  Lab 02/23/24 1944 02/23/24 1958 02/24/24 0418 02/25/24 0410 02/25/24 1750 02/26/24 0339  WBC 11.2*  --   --  14.0* 11.2* 9.3  HGB 12.7 12.2 11.7* 11.0* 11.9* 11.1*  HCT 39.4 36.0 36.0 33.5* 36.2 33.7*  PLT 409*  --   --  392 447* 414*  MCV 91.6  --   --  91.5 91.0 91.3  MCH 29.5  --   --  30.1 29.9 30.1  MCHC 32.2  --   --  32.8 32.9 32.9  RDW 12.7  --   --  12.6 12.7 12.6  LYMPHSABS 1.4  --   --  1.5  --  1.4  MONOABS 1.6*  --   --  2.3*  --  1.3*  EOSABS 0.1  --   --  0.0  --  0.2  BASOSABS 0.1  --   --  0.1  --  0.1    Recent Labs  Lab 02/23/24 1944 02/23/24 1958 02/24/24 0418 02/25/24 0410 02/26/24 0339  NA 131* 132* 134* 136 136  K 3.3* 3.2* 3.5 4.1 3.5  CL 99 95* 99 99 99  CO2 22  --  25 25 23   ANIONGAP 10  --  10 12 14    GLUCOSE 112* 112* 108* 119* 92  BUN 23 25* 18 10 6*  CREATININE 1.35* 1.40* 1.19* 1.00 0.94  AST 21  --   --   --   --   ALT 12  --   --   --   --   ALKPHOS 46  --   --   --   --   BILITOT 0.7  --   --   --   --   ALBUMIN 2.9*  --  2.7* 2.4*  --   LATICACIDVEN  --  1.7  --   --   --  INR 1.1  --   --   --   --   MG  --   --  1.9 1.3* 1.9  PHOS  --   --  2.5 2.4*  --   CALCIUM  9.1  --  8.8* 8.6* 8.6*      Recent Labs  Lab 02/23/24 1944 02/23/24 1958 02/24/24 0418 02/25/24 0410 02/26/24 0339  LATICACIDVEN  --  1.7  --   --   --   INR 1.1  --   --   --   --   MG  --   --  1.9 1.3* 1.9  CALCIUM  9.1  --  8.8* 8.6* 8.6*    --------------------------------------------------------------------------------------------------------------- No results found for: CHOL, HDL, LDLCALC, LDLDIRECT, TRIG, CHOLHDL  No results found for: HGBA1C No results for input(s): TSH, T4TOTAL, FREET4, T3FREE, THYROIDAB in the last 72 hours. No results for input(s): VITAMINB12, FOLATE, FERRITIN, TIBC, IRON, RETICCTPCT in the last 72 hours. ------------------------------------------------------------------------------------------------------------------ Cardiac Enzymes No results for input(s): CKMB, TROPONINI, MYOGLOBIN in the last 168 hours.  Invalid input(s): CK  Micro Results No results found for this or any previous visit (from the past 240 hours).  Radiology Reports  No results found.     Signature  -   Lavada Stank M.D on 02/26/2024 at 9:45 AM   -  To page go to [email or web address]

## 2024-02-26 NOTE — Transfer of Care (Signed)
 Immediate Anesthesia Transfer of Care Note  Patient: Darlene Sanders  Procedure(s) Performed: COLONOSCOPY  Patient Location: PACU  Anesthesia Type:MAC  Level of Consciousness: drowsy  Airway & Oxygen Therapy: Patient Spontanous Breathing and Patient connected to face mask oxygen  Post-op Assessment: Report given to RN and Post -op Vital signs reviewed and stable  Post vital signs: Reviewed and stable  Last Vitals:  Vitals Value Taken Time  BP    Temp    Pulse 51 02/26/24 09:02  Resp 12 02/26/24 09:02  SpO2 99 % 02/26/24 09:02  Vitals shown include unfiled device data.  Last Pain:  Vitals:   02/26/24 0807  TempSrc:   PainSc: 0-No pain      Patients Stated Pain Goal: 0 (02/25/24 0110)  Complications: No notable events documented.

## 2024-02-26 NOTE — Interval H&P Note (Signed)
 History and Physical Interval Note:  02/26/2024 8:23 AM  Darlene Sanders  has presented today for surgery, with the diagnosis of Rectal bleeding.  The various methods of treatment have been discussed with the patient and family. After consideration of risks, benefits and other options for treatment, the patient has consented to  Procedure(s): COLONOSCOPY (N/A) as a surgical intervention.  The patient's history has been reviewed, patient examined, no change in status, stable for surgery.  I have reviewed the patient's chart and labs.  Questions were answered to the patient's satisfaction.     [name]

## 2024-02-27 ENCOUNTER — Encounter (HOSPITAL_COMMUNITY): Payer: Self-pay | Admitting: Gastroenterology

## 2024-02-27 DIAGNOSIS — K625 Hemorrhage of anus and rectum: Secondary | ICD-10-CM | POA: Diagnosis not present

## 2024-02-27 LAB — CBC WITH DIFFERENTIAL/PLATELET
Abs Immature Granulocytes: 0.08 K/uL — ABNORMAL HIGH (ref 0.00–0.07)
Basophils Absolute: 0.1 K/uL (ref 0.0–0.1)
Basophils Relative: 1 %
Eosinophils Absolute: 0.2 K/uL (ref 0.0–0.5)
Eosinophils Relative: 3 %
HCT: 31.8 % — ABNORMAL LOW (ref 36.0–46.0)
Hemoglobin: 10 g/dL — ABNORMAL LOW (ref 12.0–15.0)
Immature Granulocytes: 1 %
Lymphocytes Relative: 18 %
Lymphs Abs: 1.5 K/uL (ref 0.7–4.0)
MCH: 29.2 pg (ref 26.0–34.0)
MCHC: 31.4 g/dL (ref 30.0–36.0)
MCV: 92.7 fL (ref 80.0–100.0)
Monocytes Absolute: 1.1 K/uL — ABNORMAL HIGH (ref 0.1–1.0)
Monocytes Relative: 13 %
Neutro Abs: 5.4 K/uL (ref 1.7–7.7)
Neutrophils Relative %: 64 %
Platelets: 415 K/uL — ABNORMAL HIGH (ref 150–400)
RBC: 3.43 MIL/uL — ABNORMAL LOW (ref 3.87–5.11)
RDW: 12.9 % (ref 11.5–15.5)
WBC: 8.4 K/uL (ref 4.0–10.5)
nRBC: 0 % (ref 0.0–0.2)

## 2024-02-27 LAB — BASIC METABOLIC PANEL WITH GFR
Anion gap: 8 (ref 5–15)
BUN: <5 mg/dL — ABNORMAL LOW (ref 8–23)
CO2: 24 mmol/L (ref 22–32)
Calcium: 8.2 mg/dL — ABNORMAL LOW (ref 8.9–10.3)
Chloride: 103 mmol/L (ref 98–111)
Creatinine, Ser: 0.87 mg/dL (ref 0.44–1.00)
GFR, Estimated: >60 mL/min (ref >60)
Glucose, Bld: 91 mg/dL (ref 70–99)
Potassium: 3.7 mmol/L (ref 3.5–5.1)
Sodium: 135 mmol/L (ref 135–145)

## 2024-02-27 LAB — MAGNESIUM: Magnesium: 1.7 mg/dL (ref 1.7–2.4)

## 2024-02-27 MED ORDER — CIPROFLOXACIN HCL 500 MG PO TABS
500.0000 mg | ORAL_TABLET | Freq: Two times a day (BID) | ORAL | Status: DC
  Administered 2024-02-27 (×2): 500 mg via ORAL
  Filled 2024-02-27 (×4): qty 1

## 2024-02-27 MED ORDER — MAGNESIUM SULFATE 2 GM/50ML IV SOLN
2.0000 g | Freq: Once | INTRAVENOUS | Status: AC
  Administered 2024-02-27: 2 g via INTRAVENOUS
  Filled 2024-02-27: qty 50

## 2024-02-27 MED ORDER — METRONIDAZOLE 500 MG PO TABS
500.0000 mg | ORAL_TABLET | Freq: Three times a day (TID) | ORAL | Status: DC
  Administered 2024-02-27 – 2024-02-28 (×3): 500 mg via ORAL
  Filled 2024-02-27 (×3): qty 1

## 2024-02-27 MED ORDER — POTASSIUM CHLORIDE CRYS ER 20 MEQ PO TBCR
40.0000 meq | EXTENDED_RELEASE_TABLET | Freq: Once | ORAL | Status: AC
  Administered 2024-02-27: 40 meq via ORAL
  Filled 2024-02-27: qty 2

## 2024-02-27 MED ORDER — CIPROFLOXACIN 500 MG/5ML (10%) PO SUSR
500.0000 mg | Freq: Two times a day (BID) | ORAL | Status: DC
  Filled 2024-02-27 (×2): qty 5

## 2024-02-27 NOTE — Progress Notes (Signed)
 PROGRESS NOTE    Darlene Sanders  FMW:[id number] DOB: 07-09-1946 DOA: 02/23/2024 PCP: Aisha Harvey, MD  Outpatient Specialists:     Brief Narrative:  Patient is a 78 year old female with past medical history significant for diverticulosis, hypertension, hyperlipidemia and hypothyroidism.  Patient presented with watery diarrhea and bright red blood per rectum.  No associated fever or chills.  No abdominal pain, nausea or vomiting.  Patient is known to Dr. Rosalie, GI physician.  GI team had planned to proceed with colonoscopy around the later part of the month.  Patient also reported feeling very weak, likely from volume depletion.  Patient has had chronic and intermittent hyponatremia.  BMP done on presentation revealed sodium of 131, potassium of 3.3, chloride of 99, CO2 of 22, BUN of 23, serum creatinine of 1.35 (baseline serum creatinine of 0.58), magnesium  of 1.9, albumin of 2.9, WBC of 11.2 and hemoglobin of 12.7 (baseline hemoglobin of 14.6 g/dL).  Vital signs reviewed revealed intermittent low blood pressure and mild tachycardia (but responded to volume resuscitation).  CT angio GI bleed revealed: 1. Descending and sigmoid diverticulosis. Fat stranding of the sigmoid colon with a prominent diverticulum or ulcerated outpouching at the superior aspect of the mid sigmoid colon measuring 3.2 x 3.0 cm. There appears to be a small, linear radiopaque foreign body within the wall and adjacent mesenteric fat of the fundus of the outpouching measuring 1.0 cm in length. This is possibly this chronic sequelae of a diverticular abscess which has retained an ingested foreign body. 2. Severe circumferential wall thickening of the rectum with extensive perirectal and presacral fat stranding, generally most consistent with nonspecific infectious or inflammatory proctitis but rectal mass is distinctly not excluded given this appearance. 3. No intraluminal contrast extravasation or other findings  to specifically localize nidus of GI bleeding. 4. Normal contour and caliber of the abdominal aorta. No evidence of aneurysm, dissection, or other acute aortic pathology. Moderate aortic atherosclerosis.   Aortic Atherosclerosis  02/24/2024: Patient seen.  Patient continues to have diarrhea and rectal bleeding.  Awaiting GI input.  Patient is currently on IV Rocephin  and Flagyl .   Assessment & Plan:   Principal Problem:   Rectal bleeding   Acute diverticular lower GI bleed:  - [facility] following, being managed with conservative management with bowel rest, antibiotics and close monitoring of CBC, H&H currently stable, type screen done patient agreeable for transfusion if needed.  Colonoscopy done on 02/26/2024, rectosigmoid ulceration noted, overall bleeding is going down but still bleeding some, switch to oral antibiotic complete a total of 2-week course, discussed with GI follow-up biopsy results.  Advance diet if stays stable discharge on 02/28/2024   Acute blood loss anemia Symptomatic anemia -See above documentation.      Hyponatremia with AKI improved after IV fluid  Hypokalemia, likely from GI losses secondary to frequent stools Replaced   Chronic anxiety/depression Resume home regimen.   DVT prophylaxis: SCD Code Status: Full code. Family Communication:  Disposition Plan: Inpatient.     Consultants:  [facility].  Procedures:  CT scan - 1. Descending and sigmoid diverticulosis. Fat stranding of the sigmoid colon with a prominent diverticulum or ulcerated outpouching at the superior aspect of the mid sigmoid colon measuring 3.2 x 3.0 cm. There appears to be a small, linear radiopaque foreign body within the wall and adjacent mesenteric fat of the fundus of the outpouching measuring 1.0 cm in length. This is possibly this chronic sequelae of a diverticular abscess which has retained an  ingested foreign body. 2. Severe circumferential wall thickening of the rectum with  extensive perirectal and presacral fat stranding, generally most consistent with nonspecific infectious or inflammatory proctitis but rectal mass is distinctly not excluded given this appearance. 3. No intraluminal contrast extravasation or other findings to specifically localize nidus of GI bleeding. 4. Normal contour and caliber of the abdominal aorta. No evidence of aneurysm, dissection, or other acute aortic pathology. Moderate aortic atherosclerosis. Aortic Atherosclerosis   Colonoscopy by Dr. Jerrell Sol on 02/26/2024.  Impression:               - Preparation of the colon was fair.                           - Congested, erythematous, eroded, inflamed,                            ulcerated and vascular-pattern-decreased mucosa in                            the rectum, in the recto-sigmoid colon, in the                            sigmoid colon and from 0 to 30 cm proximal to the                            anus. Biopsied.                           - Internal hemorrhoids.                           - Diverticulosis in the sigmoid colon and in the                            descending colon.                           - The examined portion of the ileum was normal.                           - Congested, inflamed and nodular mucosa in the                            descending colon. Recommendation:           - Await pathology results.                           - Repeat colonoscopy for surveillance based on                            pathology results.                           - Advance diet as tolerated and clear liquid diet.    Antimicrobials:  IV Rocephin . IV Flagyl .   Subjective:  Patient in bed, appears comfortable, denies any headache, no fever,  no chest pain or pressure, no shortness of breath , no abdominal pain, she is still has some blood in her stool. No new focal weakness.    Objective: Vitals:   02/27/24 0320 02/27/24 0330 02/27/24 0340 02/27/24 0812  BP:    133/79   Pulse: 64 66 66 87  Resp: 20 (!) 22 (!) 21 (!) 23  Temp:    98.4 F (36.9 C)  TempSrc:    Oral  SpO2: 93% 93% 93% 93%  Weight:      Height:        Intake/Output Summary (Last 24 hours) at 02/27/2024 0934 Last data filed at 02/26/2024 1700 Gross per 24 hour  Intake 346.43 ml  Output --  Net 346.43 ml   Filed Weights   02/23/24 1937  Weight: 61.2 kg    Examination:  Awake Alert, No new F.N deficits, Normal affect [place].AT,PERRAL Supple Neck, No JVD,   Symmetrical Chest wall movement, Good air movement bilaterally, CTAB RRR,No Gallops, Rubs or new Murmurs,  +ve B.Sounds, Abd Soft, No tenderness,   No Cyanosis, Clubbing or edema    Data Reviewed: I have personally reviewed following labs and imaging studies   Data Review:   Inpatient Medications  Scheduled Meds:  cholecalciferol   2,000 Units Oral QODAY   ciprofloxacin   500 mg Oral BID   cyanocobalamin   500 mcg Oral Daily   escitalopram   15 mg Oral Daily   levothyroxine   88 mcg Oral QAC breakfast   metroNIDAZOLE   500 mg Oral Q8H   sodium chloride  flush  10-40 mL Intracatheter Q12H   Continuous Infusions:   PRN Meds:.ondansetron  (ZOFRAN ) IV  DVT Prophylaxis  SCDs Start: 02/23/24 2238   Recent Labs  Lab 02/23/24 1944 02/23/24 1958 02/25/24 0410 02/25/24 1750 02/26/24 0339 02/26/24 2130 02/27/24 0512  WBC 11.2*  --  14.0* 11.2* 9.3 8.7 8.4  HGB 12.7   < > 11.0* 11.9* 11.1* 10.2* 10.0*  HCT 39.4   < > 33.5* 36.2 33.7* 31.4* 31.8*  PLT 409*  --  392 447* 414* 430* 415*  MCV 91.6  --  91.5 91.0 91.3 91.0 92.7  MCH 29.5  --  30.1 29.9 30.1 29.6 29.2  MCHC 32.2  --  32.8 32.9 32.9 32.5 31.4  RDW 12.7  --  12.6 12.7 12.6 12.8 12.9  LYMPHSABS 1.4  --  1.5  --  1.4 1.3 1.5  MONOABS 1.6*  --  2.3*  --  1.3* 1.2* 1.1*  EOSABS 0.1  --  0.0  --  0.2 0.2 0.2  BASOSABS 0.1  --  0.1  --  0.1 0.1 0.1   < > = values in this interval not displayed.    Recent Labs  Lab 02/23/24 1944 02/23/24 1958 02/24/24 0418  02/25/24 0410 02/26/24 0339 02/27/24 0512  NA 131* 132* 134* 136 136 135  K 3.3* 3.2* 3.5 4.1 3.5 3.7  CL 99 95* 99 99 99 103  CO2 22  --  25 25 23 24   ANIONGAP 10  --  10 12 14 8   GLUCOSE 112* 112* 108* 119* 92 91  BUN 23 25* 18 10 6* <5*  CREATININE 1.35* 1.40* 1.19* 1.00 0.94 0.87  AST 21  --   --   --   --   --   ALT 12  --   --   --   --   --   ALKPHOS 46  --   --   --   --   --  BILITOT 0.7  --   --   --   --   --   ALBUMIN 2.9*  --  2.7* 2.4*  --   --   LATICACIDVEN  --  1.7  --   --   --   --   INR 1.1  --   --   --   --   --   MG  --   --  1.9 1.3* 1.9 1.7  PHOS  --   --  2.5 2.4*  --   --   CALCIUM  9.1  --  8.8* 8.6* 8.6* 8.2*    Signature  -   Lavada Stank M.D on 02/27/2024 at 9:34 AM   -  To page go to [email or web address]

## 2024-02-27 NOTE — Plan of Care (Signed)

## 2024-02-27 NOTE — Progress Notes (Signed)
 [facility] Gastroenterology Progress Note  Darlene Sanders 78 y.o. 1946-08-02   Subjective: Sleeping comfortably. Rectal bleeding overnight. Denies abdominal pain.  Objective: Vital signs: Vitals:   02/27/24 0340 02/27/24 0812  BP:  133/79  Pulse: 66 87  Resp: (!) 21 (!) 23  Temp:  98.4 F (36.9 C)  SpO2: 93% 93%    Physical Exam: Gen: lethargic, elderly, well-nourished, no acute distress  HEENT: anicteric sclera CV: RRR Chest: CTA B Abd: soft, nontender, nondistended, +BS Ext: no edema  Lab Results: Recent Labs    02/25/24 0410 02/26/24 0339 02/27/24 0512  NA 136 136 135  K 4.1 3.5 3.7  CL 99 99 103  CO2 25 23 24   GLUCOSE 119* 92 91  BUN 10 6* <5*  CREATININE 1.00 0.94 0.87  CALCIUM  8.6* 8.6* 8.2*  MG 1.3* 1.9 1.7  PHOS 2.4*  --   --    Recent Labs    02/25/24 0410  ALBUMIN 2.4*   Recent Labs    02/26/24 2130 02/27/24 0512  WBC 8.7 8.4  NEUTROABS 5.9 5.4  HGB 10.2* 10.0*  HCT 31.4* 31.8*  MCV 91.0 92.7  PLT 430* 415*      Assessment/Plan: 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 Doubt malignancy. Biopsies pending. Hgb 10. Continue antibiotics. Advance diet. Supportive care. Home tomorrow if stable. F/U with Dr. Rosalie in 4-6 weeks.   [name] 02/27/2024, 9:39 AM  Questions please call [phone] ID: Darlene Sanders, female   DOB: 1945/11/18, 78 y.o.   MRN: [id number]

## 2024-02-28 ENCOUNTER — Other Ambulatory Visit (HOSPITAL_COMMUNITY): Payer: Self-pay

## 2024-02-28 DIAGNOSIS — K625 Hemorrhage of anus and rectum: Secondary | ICD-10-CM | POA: Diagnosis not present

## 2024-02-28 LAB — CBC WITH DIFFERENTIAL/PLATELET
Abs Immature Granulocytes: 0.1 K/uL — ABNORMAL HIGH (ref 0.00–0.07)
Basophils Absolute: 0.1 K/uL (ref 0.0–0.1)
Basophils Relative: 1 %
Eosinophils Absolute: 0.2 K/uL (ref 0.0–0.5)
Eosinophils Relative: 2 %
HCT: 34.5 % — ABNORMAL LOW (ref 36.0–46.0)
Hemoglobin: 11.2 g/dL — ABNORMAL LOW (ref 12.0–15.0)
Immature Granulocytes: 1 %
Lymphocytes Relative: 16 %
Lymphs Abs: 1.3 K/uL (ref 0.7–4.0)
MCH: 29.9 pg (ref 26.0–34.0)
MCHC: 32.5 g/dL (ref 30.0–36.0)
MCV: 92 fL (ref 80.0–100.0)
Monocytes Absolute: 1.2 K/uL — ABNORMAL HIGH (ref 0.1–1.0)
Monocytes Relative: 15 %
Neutro Abs: 5.2 K/uL (ref 1.7–7.7)
Neutrophils Relative %: 65 %
Platelets: 404 K/uL — ABNORMAL HIGH (ref 150–400)
RBC: 3.75 MIL/uL — ABNORMAL LOW (ref 3.87–5.11)
RDW: 13.1 % (ref 11.5–15.5)
WBC: 8 K/uL (ref 4.0–10.5)
nRBC: 0 % (ref 0.0–0.2)

## 2024-02-28 LAB — BASIC METABOLIC PANEL WITH GFR
Anion gap: 10 (ref 5–15)
BUN: <5 mg/dL — ABNORMAL LOW (ref 8–23)
CO2: 23 mmol/L (ref 22–32)
Calcium: 8.7 mg/dL — ABNORMAL LOW (ref 8.9–10.3)
Chloride: 104 mmol/L (ref 98–111)
Creatinine, Ser: 0.78 mg/dL (ref 0.44–1.00)
GFR, Estimated: >60 mL/min (ref >60)
Glucose, Bld: 95 mg/dL (ref 70–99)
Potassium: 4.4 mmol/L (ref 3.5–5.1)
Sodium: 137 mmol/L (ref 135–145)

## 2024-02-28 LAB — TYPE AND SCREEN
ABO/RH(D): O POS
Antibody Screen: NEGATIVE

## 2024-02-28 LAB — MAGNESIUM: Magnesium: 1.5 mg/dL — ABNORMAL LOW (ref 1.7–2.4)

## 2024-02-28 LAB — SURGICAL PATHOLOGY

## 2024-02-28 MED ORDER — METRONIDAZOLE 500 MG PO TABS
500.0000 mg | ORAL_TABLET | Freq: Three times a day (TID) | ORAL | 0 refills | Status: AC
  Filled 2024-02-28: qty 16, 6d supply, fill #0

## 2024-02-28 MED ORDER — MAGNESIUM SULFATE 4 GM/100ML IV SOLN
4.0000 g | Freq: Once | INTRAVENOUS | Status: AC
  Administered 2024-02-28: 4 g via INTRAVENOUS
  Filled 2024-02-28 (×2): qty 100

## 2024-02-28 MED ORDER — LOSARTAN POTASSIUM-HCTZ 100-12.5 MG PO TABS: 1.0000 | ORAL_TABLET | Freq: Every day | ORAL | Status: AC

## 2024-02-28 MED ORDER — DOCUSATE SODIUM 100 MG PO CAPS
200.0000 mg | ORAL_CAPSULE | Freq: Two times a day (BID) | ORAL | 0 refills | Status: AC | PRN
  Filled 2024-02-28: qty 30, 8d supply, fill #0

## 2024-02-28 MED ORDER — CIPROFLOXACIN HCL 500 MG PO TABS
500.0000 mg | ORAL_TABLET | Freq: Two times a day (BID) | ORAL | 0 refills | Status: AC
  Filled 2024-02-28: qty 16, 8d supply, fill #0

## 2024-02-28 NOTE — Discharge Summary (Signed)
 Darlene Sanders FMW:[id number] DOB: 12-01-1945 DOA: 02/23/2024  PCP: Aisha Harvey, MD  Admit date: 02/23/2024  Discharge date: 02/28/2024  Admitted From: Home   Disposition:  Home   Recommendations for Outpatient Follow-up:   Follow up with PCP in 1-2 weeks  PCP Please obtain BMP/CBC, 2 view CXR in 1week,  (see Discharge instructions)   PCP Please follow up on the following pending results:     Home Health: None   Equipment/Devices: None  Consultations: GI Discharge Condition: Stable    CODE STATUS: Full    Diet Recommendation: Soft diet for 1 week  Chief Complaint  Patient presents with   Rectal Bleeding     Brief history of present illness from the day of admission and additional interim summary    78 year old female with past medical history significant for diverticulosis, hypertension, hyperlipidemia and hypothyroidism. Patient presented with watery diarrhea and bright red blood per rectum. No associated fever or chills. No abdominal pain, nausea or vomiting. Patient is known to Dr. Rosalie, GI physician. GI team had planned to proceed with colonoscopy around the later part of the month. Patient also reported feeling very weak, likely from volume depletion.  In the ER workup consistent with lower GI bleed and she was admitted for further care.                                                                  Hospital Course   Acute diverticular lower GI bleed:  - [facility] following, being managed with conservative management with bowel rest, antibiotics and close monitoring of CBC, H&H currently stable, type screen done patient agreeable for transfusion if needed.  Colonoscopy done on 02/26/2024, rectosigmoid ulceration noted, overall bleeding is going down but still bleeding some, switch to oral antibiotic  complete a total of 2-week course, discussed with GI Dr. Dianna, CBC remained stable, patient overall much improved, bleeding has almost completely resolved now, will be discharged home on oral antibiotics to complete 2-week course with outpatient PCP and GI follow-up.   Acute blood loss anemia Symptomatic anemia -See above documentation.     Hyponatremia with AKI improved after IV fluid   Hypokalemia, hypomagnesemia likely from GI losses secondary to frequent stools Replaced, PCP to monitor   Chronic anxiety/depression Resume home regimen.   Discharge instructions    Discharge Instructions     Discharge instructions   Complete by: As directed    Follow with Primary MD Aisha Harvey, MD in 7 days   Get CBC, CMP, magnesium  -  checked next visit with your primary MD    Activity: As tolerated with Full fall precautions use walker/cane & assistance as needed  Disposition Home   Diet: Soft diet for another 1 week thereafter advance to regular consistency heart healthy diet as  tolerated\  Special Instructions: If you have smoked or chewed Tobacco  in the last 2 yrs please stop smoking, stop any regular Alcohol  and or any Recreational drug use.  On your next visit with your primary care physician please Get Medicines reviewed and adjusted.  Please request your Prim.MD to go over all Hospital Tests and Procedure/Radiological results at the follow up, please get all Hospital records sent to your Prim MD by signing hospital release before you go home.  If you experience worsening of your admission symptoms, develop shortness of breath, life threatening emergency, suicidal or homicidal thoughts you must seek medical attention immediately by calling 911 or calling your MD immediately  if symptoms less severe.  You Must read complete instructions/literature along with all the possible adverse reactions/side effects for all the Medicines you take and that have been prescribed to you.  Take any new Medicines after you have completely understood and accpet all the possible adverse reactions/side effects.   Do not drive when taking Pain medications.  Do not take more than prescribed Pain, Sleep and Anxiety Medications  Wear Seat belts while driving.   Increase activity slowly   Complete by: As directed        Discharge Medications   Allergies as of 02/28/2024       Reactions   Codeine Nausea Only   Fluorescein Other (See Comments)   Pt says she passed out   Hydromorphone  Nausea Only   Latex Rash, Cough   Penicillins Rash   Has patient had a PCN reaction causing immediate rash, facial/tongue/throat swelling, SOB or lightheadedness with hypotension: unknown Has patient had a PCN reaction causing severe rash involving mucus membranes or skin necrosis: Yes Has patient had a PCN reaction that required hospitalization No Has patient had a PCN reaction occurring within the last 10 years: No If all of the above answers are NO, then may proceed with Cephalosporin use.        Medication List     TAKE these medications    ciprofloxacin  500 MG tablet Commonly known as: CIPRO  Take 1 tablet (500 mg total) by mouth 2 (two) times daily.   docusate sodium  100 MG capsule Commonly known as: Colace Take 2 capsules (200 mg total) by mouth 2 (two) times daily as needed for mild constipation.   escitalopram  10 MG tablet Commonly known as: LEXAPRO  Take 15 mg by mouth daily.   losartan -hydrochlorothiazide  100-12.5 MG tablet Commonly known as: HYZAAR Take 1 tablet by mouth daily. Start taking on: March 01, 2024 What changed: These instructions start on March 01, 2024. If you are unsure what to do until then, ask your doctor or other care provider.   metroNIDAZOLE  500 MG tablet Commonly known as: FLAGYL  Take 1 tablet (500 mg total) by mouth every 8 (eight) hours.   rosuvastatin  20 MG tablet Commonly known as: CRESTOR  Take 20 mg by mouth daily.   Synthroid  88 MCG  tablet Generic drug: levothyroxine  Take 88 mcg by mouth daily before breakfast.   Vitamin B 12 500 MCG Tabs Take 500 mcg by mouth daily.   vitamin D3 50 MCG (2000 UT) Caps Take 2,000 Units by mouth every other day.         Follow-up Information     Aisha Harvey, MD. Schedule an appointment as soon as possible for a visit in 1 week(s).   Specialty: Family Medicine Contact information: [address] [place] [address] [phone]  Dianna Specking, MD. Schedule an appointment as soon as possible for a visit in 1 week(s).   Specialty: Gastroenterology Contact information: [address] [place] [address] [phone]                 Major procedures and Radiology Reports - PLEASE review detailed and final reports thoroughly  -      CT ANGIO GI BLEED Result Date: 02/23/2024 CLINICAL DATA:  GI bleed * Tracking Code: BO * EXAM: CTA ABDOMEN AND PELVIS WITHOUT AND WITH CONTRAST TECHNIQUE: Multidetector CT imaging of the abdomen and pelvis was performed using the standard protocol during bolus administration of intravenous contrast. Multiplanar reconstructed images and MIPs were obtained and reviewed to evaluate the vascular anatomy. RADIATION DOSE REDUCTION: This exam was performed according to the departmental dose-optimization program which includes automated exposure control, adjustment of the mA and/or kV according to patient size and/or use of iterative reconstruction technique. CONTRAST:  OMNIPAQUE  IOHEXOL  350 MG/ML SOLN COMPARISON:  None Available. FINDINGS: VASCULAR Normal contour and caliber of the abdominal aorta. No evidence of aneurysm, dissection, or other acute aortic pathology. Standard branching pattern of the abdominal aorta with solitary bilateral renal arteries. Moderate aortic atherosclerosis. Review of the MIP images confirms the above findings. NON-VASCULAR Lower Chest: No acute findings.  Small hiatal hernia.  Hepatobiliary: No solid liver abnormality is seen. No gallstones, gallbladder wall thickening, or biliary dilatation. Pancreas: Unremarkable. No pancreatic ductal dilatation or surrounding inflammatory changes. Spleen: Normal in size without significant abnormality. Adrenals/Urinary Tract: Adrenal glands are unremarkable. Simple, benign bilateral renal cortical cysts, for which no further follow-up or characterization is required. Kidneys are otherwise normal, without renal calculi, solid lesion, or hydronephrosis. Bladder is unremarkable. Stomach/Bowel: Stomach is within normal limits. Appendix appears normal. Descending and sigmoid diverticulosis. Fat stranding of the sigmoid colon with a prominent diverticulum or ulcerated outpouching at the superior aspect of the mid sigmoid colon measuring 3.2 x 3.0 cm (series 16, image 66). There appears to be a small, linear radiopaque foreign body within the wall and adjacent mesenteric fat of the fundus of the outpouching measuring 1.0 cm in length. Severe circumferential wall thickening of the rectum with extensive perirectal and presacral fat stranding (series 9, image 73). Lymphatic: No enlarged abdominal or pelvic lymph nodes. Reproductive: Fibroid of the uterine fundus (series 9, image 64). Other: Ventral hernia mesh repair.  No ascites. Musculoskeletal: No acute osseous findings. Status post right hip total arthroplasty. IMPRESSION: 1. Descending and sigmoid diverticulosis. Fat stranding of the sigmoid colon with a prominent diverticulum or ulcerated outpouching at the superior aspect of the mid sigmoid colon measuring 3.2 x 3.0 cm. There appears to be a small, linear radiopaque foreign body within the wall and adjacent mesenteric fat of the fundus of the outpouching measuring 1.0 cm in length. This is possibly this chronic sequelae of a diverticular abscess which has retained an ingested foreign body. 2. Severe circumferential wall thickening of the rectum with  extensive perirectal and presacral fat stranding, generally most consistent with nonspecific infectious or inflammatory proctitis but rectal mass is distinctly not excluded given this appearance. 3. No intraluminal contrast extravasation or other findings to specifically localize nidus of GI bleeding. 4. Normal contour and caliber of the abdominal aorta. No evidence of aneurysm, dissection, or other acute aortic pathology. Moderate aortic atherosclerosis. Aortic Atherosclerosis (ICD10-I70.0). Electronically Signed   By: Marolyn JONETTA Jaksch M.D.   On: 02/23/2024 20:53    Micro Results    No  results found for this or any previous visit (from the past 240 hours).  Today   Subjective    Darlene Sanders today has no headache,no chest abdominal pain,no new weakness tingling or numbness, feels much better wants to go home today.    Objective   Blood pressure 127/85, pulse 75, temperature 97.8 F (36.6 C), temperature source Oral, resp. rate 11, height 5' 6 (1.676 m), weight 61.2 kg, SpO2 96%.   Intake/Output Summary (Last 24 hours) at 02/28/2024 0835 Last data filed at 02/27/2024 1200 Gross per 24 hour  Intake 480 ml  Output --  Net 480 ml    Exam  Awake Alert, No new F.N deficits,    [place].AT,PERRAL Supple Neck,   Symmetrical Chest wall movement, Good air movement bilaterally, CTAB RRR,No Gallops,   +ve B.Sounds, Abd Soft, Non tender,  No Cyanosis, Clubbing or edema    Data Review   Recent Labs  Lab 02/25/24 0410 02/25/24 1750 02/26/24 0339 02/26/24 2130 02/27/24 0512 02/28/24 0535  WBC 14.0* 11.2* 9.3 8.7 8.4 8.0  HGB 11.0* 11.9* 11.1* 10.2* 10.0* 11.2*  HCT 33.5* 36.2 33.7* 31.4* 31.8* 34.5*  PLT 392 447* 414* 430* 415* 404*  MCV 91.5 91.0 91.3 91.0 92.7 92.0  MCH 30.1 29.9 30.1 29.6 29.2 29.9  MCHC 32.8 32.9 32.9 32.5 31.4 32.5  RDW 12.6 12.7 12.6 12.8 12.9 13.1  LYMPHSABS 1.5  --  1.4 1.3 1.5 1.3  MONOABS 2.3*  --  1.3* 1.2* 1.1* 1.2*  EOSABS 0.0  --  0.2 0.2 0.2 0.2   BASOSABS 0.1  --  0.1 0.1 0.1 0.1    Recent Labs  Lab 02/23/24 1944 02/23/24 1958 02/24/24 0418 02/25/24 0410 02/26/24 0339 02/27/24 0512 02/28/24 0535  NA 131* 132* 134* 136 136 135 137  K 3.3* 3.2* 3.5 4.1 3.5 3.7 4.4  CL 99 95* 99 99 99 103 104  CO2 22  --  25 25 23 24 23   ANIONGAP 10  --  10 12 14 8 10   GLUCOSE 112* 112* 108* 119* 92 91 95  BUN 23 25* 18 10 6* <5* <5*  CREATININE 1.35* 1.40* 1.19* 1.00 0.94 0.87 0.78  AST 21  --   --   --   --   --   --   ALT 12  --   --   --   --   --   --   ALKPHOS 46  --   --   --   --   --   --   BILITOT 0.7  --   --   --   --   --   --   ALBUMIN 2.9*  --  2.7* 2.4*  --   --   --   LATICACIDVEN  --  1.7  --   --   --   --   --   INR 1.1  --   --   --   --   --   --   MG  --   --  1.9 1.3* 1.9 1.7 1.5*  PHOS  --   --  2.5 2.4*  --   --   --   CALCIUM  9.1  --  8.8* 8.6* 8.6* 8.2* 8.7*    Total Time in preparing paper work, data evaluation and todays exam - 35 minutes  Signature  -    Lavada Stank M.D on 02/28/2024 at 8:35 AM   -  To page go  to [email or web address]

## 2024-02-28 NOTE — TOC Transition Note (Signed)
 Transition of Care [facility]) - Discharge Note   Patient Details  Name: Darlene Sanders MRN: [id number] Date of Birth: 05/06/1946  [facility] CM/SW Contact:  Corean JAYSON Canary, RN Phone Number: 02/28/2024, 9:15 AM   Clinical Narrative:    Patient is transitioning home today, no needs identified    Final next level of care: Home/Self Care Barriers to Discharge: No Barriers Identified   Patient Goals and CMS Choice            Discharge Placement                       Discharge Plan and Services Additional resources added to the After Visit Summary for                                       Social Drivers of Health (SDOH) Interventions SDOH Screenings   Food Insecurity: No Food Insecurity (02/25/2024)  Housing: Low Risk  (02/25/2024)  Transportation Needs: No Transportation Needs (02/25/2024)  Utilities: Not At Risk (02/25/2024)  Social Connections: Moderately Isolated (02/25/2024)  Tobacco Use: High Risk (02/26/2024)     Readmission Risk Interventions     No data to display

## 2024-02-28 NOTE — Plan of Care (Signed)

## 2024-02-28 NOTE — Discharge Instructions (Addendum)
 Do not drive, operate heavy machinery, perform activities at heights, swimming or participation in water  activities or provide baby sitting services until you have seen by Primary MD or a Neurologist and advised to do so again.  Follow with Primary MD Aisha Harvey, MD in 7 days   Get CBC, CMP, magnesium  -  checked next visit with your primary MD    Activity: As tolerated with Full fall precautions use walker/cane & assistance as needed  Disposition Home   Diet: Soft diet for another 1 week thereafter advance to regular consistency heart healthy diet as tolerated\  Special Instructions: If you have smoked or chewed Tobacco  in the last 2 yrs please stop smoking, stop any regular Alcohol  and or any Recreational drug use.  On your next visit with your primary care physician please Get Medicines reviewed and adjusted.  Please request your Prim.MD to go over all Hospital Tests and Procedure/Radiological results at the follow up, please get all Hospital records sent to your Prim MD by signing hospital release before you go home.  If you experience worsening of your admission symptoms, develop shortness of breath, life threatening emergency, suicidal or homicidal thoughts you must seek medical attention immediately by calling 911 or calling your MD immediately  if symptoms less severe.  You Must read complete instructions/literature along with all the possible adverse reactions/side effects for all the Medicines you take and that have been prescribed to you. Take any new Medicines after you have completely understood and accpet all the possible adverse reactions/side effects.   Do not drive when taking Pain medications.  Do not take more than prescribed Pain, Sleep and Anxiety Medications  Wear Seat belts while driving.

## 2024-03-05 DIAGNOSIS — I10 Essential (primary) hypertension: Secondary | ICD-10-CM | POA: Diagnosis not present

## 2024-03-05 DIAGNOSIS — K625 Hemorrhage of anus and rectum: Secondary | ICD-10-CM | POA: Diagnosis not present

## 2024-03-05 DIAGNOSIS — Z6824 Body mass index (BMI) 24.0-24.9, adult: Secondary | ICD-10-CM | POA: Diagnosis not present

## 2024-03-05 DIAGNOSIS — K921 Melena: Secondary | ICD-10-CM | POA: Diagnosis not present

## 2024-03-05 DIAGNOSIS — K51211 Ulcerative (chronic) proctitis with rectal bleeding: Secondary | ICD-10-CM | POA: Diagnosis not present

## 2024-03-11 DIAGNOSIS — K529 Noninfective gastroenteritis and colitis, unspecified: Secondary | ICD-10-CM | POA: Diagnosis not present

## 2024-03-12 DIAGNOSIS — N182 Chronic kidney disease, stage 2 (mild): Secondary | ICD-10-CM | POA: Diagnosis not present

## 2024-03-12 DIAGNOSIS — E039 Hypothyroidism, unspecified: Secondary | ICD-10-CM | POA: Diagnosis not present

## 2024-03-12 DIAGNOSIS — I10 Essential (primary) hypertension: Secondary | ICD-10-CM | POA: Diagnosis not present

## 2024-03-12 DIAGNOSIS — J432 Centrilobular emphysema: Secondary | ICD-10-CM | POA: Diagnosis not present

## 2024-03-14 ENCOUNTER — Telehealth: Payer: Self-pay | Admitting: Surgical

## 2024-03-14 DIAGNOSIS — G8929 Other chronic pain: Secondary | ICD-10-CM | POA: Diagnosis not present

## 2024-03-14 DIAGNOSIS — M25562 Pain in left knee: Secondary | ICD-10-CM | POA: Diagnosis not present

## 2024-03-14 NOTE — Telephone Encounter (Signed)
 Patient called the on-call service for concern of left knee swelling and pain.  Cannot put weight on her left leg.  Recently hospitalized for ulcerative colitis and was discharged on the 18th.  In the last week she has developed increased knee pain and swelling similar to about a month ago when she had left knee aspiration and cortisone injection with Mary-Anne persons PA-C.  She denies any fevers or chills.  No constitutional symptoms.  No mechanical symptoms.  She has no history of gout.  On exam, patient has about 3 degrees extension and 95 degrees of knee flexion.  No cellulitis or skin changes noted around the left knee.  Does not significantly hot to the touch.  No sinus tract noted.  She has really no pain with passive motion until terminal flexion.  No calf tenderness.  Negative Homans' sign.  Palpable PT pulse.  Able to perform straight leg raise.  No pain with hip range of motion.  There is a large effusion.  Plan for aspiration and injection with Toradol /Marcaine  today.  90 cc of yellow synovial fluid was aspirated with appearance that seems consistent with gout/pseudogout.  We will plan to send this fluid off for fluid analysis on Monday and the fluid will be held in a medical refrigerator until then.  Patient was instructed to call if she has any recurrence of symptoms and to call on Monday to update us  how she is doing.  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  She did have good symptomatic relief after injection.

## 2024-03-16 ENCOUNTER — Ambulatory Visit

## 2024-03-16 ENCOUNTER — Telehealth: Payer: Self-pay | Admitting: Surgical

## 2024-03-16 NOTE — Progress Notes (Signed)
 Nurse only visit-sending labs from knee aspirate per Herlene

## 2024-03-16 NOTE — Telephone Encounter (Signed)
 Pt called stating she seen Darlene Sanders for fluid build up on knee and seen Darlene Sanders for samething. Pt states knee is not as painful but knee is filling back up with fluid and asking for a call from Darlene Sanders or Darlene Sanders which is both familiar with her knee. Please call pt at [phone].

## 2024-03-20 NOTE — Telephone Encounter (Signed)
 I called [facility].  Total cell count in the synovial fluid was about 15,000.  No crystals seen.  Cultures negative.  Called patient to discuss and left a voicemail detailing these findings and next steps if she is continued to be symptomatic.  Recommended she reach out to me via MyChart or by calling the office on Monday if she would like to discuss options more.

## 2024-03-20 NOTE — Telephone Encounter (Signed)
 noted

## 2024-03-20 NOTE — Telephone Encounter (Signed)
 Darlene Sanders, can you help with getting the synovial fluid analysis results?  I saw the culture results which are negative which is awesome but I have not been able to check on the fluid analysis results to see what the cell count and whether or not there are crystals in the fluid.  Appreciate you

## 2024-03-22 LAB — ANAEROBIC AND AEROBIC CULTURE
AER RESULT:: NO GROWTH
MICRO NUMBER:: 16783326
MICRO NUMBER:: 16783327
SPECIMEN QUALITY:: ADEQUATE
SPECIMEN QUALITY:: ADEQUATE

## 2024-03-22 LAB — SYNOVIAL FLUID ANALYSIS, COMPLETE
Basophils, %: 0 %
Eosinophils-Synovial: 0 % (ref 0–2)
Lymphocytes-Synovial Fld: 1 % (ref 0–74)
Monocyte/Macrophage: 6 % (ref 0–69)
Neutrophil, Synovial: 91 % — ABNORMAL HIGH (ref 0–24)
Synoviocytes, %: 0 % (ref 0–15)
WBC, Synovial: 15870 {cells}/uL — ABNORMAL HIGH (ref <150)

## 2024-03-26 ENCOUNTER — Encounter (HOSPITAL_BASED_OUTPATIENT_CLINIC_OR_DEPARTMENT_OTHER): Payer: Self-pay | Admitting: Physician Assistant

## 2024-03-26 ENCOUNTER — Ambulatory Visit (HOSPITAL_BASED_OUTPATIENT_CLINIC_OR_DEPARTMENT_OTHER)

## 2024-03-26 ENCOUNTER — Ambulatory Visit (HOSPITAL_BASED_OUTPATIENT_CLINIC_OR_DEPARTMENT_OTHER): Admitting: Physician Assistant

## 2024-03-26 ENCOUNTER — Ambulatory Visit (INDEPENDENT_AMBULATORY_CARE_PROVIDER_SITE_OTHER): Admitting: Physician Assistant

## 2024-03-26 DIAGNOSIS — M7989 Other specified soft tissue disorders: Secondary | ICD-10-CM | POA: Diagnosis not present

## 2024-03-26 DIAGNOSIS — M25532 Pain in left wrist: Secondary | ICD-10-CM | POA: Diagnosis not present

## 2024-03-26 DIAGNOSIS — M79642 Pain in left hand: Secondary | ICD-10-CM

## 2024-03-26 DIAGNOSIS — M1812 Unilateral primary osteoarthritis of first carpometacarpal joint, left hand: Secondary | ICD-10-CM | POA: Diagnosis not present

## 2024-03-26 DIAGNOSIS — M109 Gout, unspecified: Secondary | ICD-10-CM | POA: Insufficient documentation

## 2024-03-26 MED ORDER — COLCHICINE 0.6 MG PO TABS: 0.6000 mg | ORAL_TABLET | Freq: Two times a day (BID) | ORAL | 0 refills | Status: AC

## 2024-03-26 NOTE — Progress Notes (Signed)
 Office Visit Note   Patient: Darlene Sanders           Date of Birth: 10-09-1945           MRN: [id number] Visit Date: 03/26/2024              Requested by: Aisha Harvey, MD [address] [place],  [place] [address] PCP: Aisha Harvey, MD   Assessment & Plan: Visit Diagnoses:  1. Pain of left hand     Plan: Patient is a pleasant 78 year old woman who comes in today with acute onset of left wrist pain and swelling.  Denies any injury denies any change in activity she just woke up with this.  Denies any fever or chills.  Recently she had an acute onset of pain and swelling in her left knee.  Although aspirate did not show crystals suspicion was high for gout.  Appearance today of her wrist and exam is consistent with this as well.  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  She has had no fever or chills.  She was hospitalized for ulcerative colitis.  I will give her a wrist plan today.  Also will put her on a low-dose of colchicine .  She understands if she has any GI reactions to that she is to stop it immediately.  She is not a really good candidate for allopurinol given her recent ulcerative colitis she may be able to take Uloric but I would defer this to her primary care provider if she gets any other increasing redness fever chills ascending cellulitis she is to go to the emergency room or contact us  on-call like to see her back in a couple weeks  Follow-Up Instructions: Return in about 2 weeks (around 04/09/2024).   Orders:  Orders Placed This Encounter  Procedures   DG Hand Complete Left   DG Wrist Complete Left   Uric acid   Meds ordered this encounter  Medications   colchicine  0.6 MG tablet    Sig: Take 1 tablet (0.6 mg total) by mouth 2 (two) times daily for 3 days.    Dispense:  10 tablet    Refill:  0      Procedures: No procedures performed   Clinical Data: No additional findings.   Subjective: Chief Complaint  Patient presents with   Left Hand -  Pain   Left Wrist - Pain    HPI patient is a pleasant 78 year old woman who comes in with a chief complaint of onset of left wrist pain swelling and redness.  She denies any injury she denies any cuts or bruises.  She is otherwise been feeling well although she in July had an episode of ulcerative colitis.  She is also been seen for similar symptoms in her left knee with thought to be gout.  Aspirate did not show crystals however very symptomatic with this.  Review of Systems  All other systems reviewed and are negative.    Objective: Vital Signs: There were no vitals taken for this visit.  Physical Exam Constitutional:      Appearance: Normal appearance.  Pulmonary:     Effort: Pulmonary effort is normal.     Breath sounds: Normal breath sounds.  Skin:    General: Skin is warm and dry.  Neurological:     General: No focal deficit present.     Mental Status: She is alert and oriented to person, place, and time.  Psychiatric:  Mood and Affect: Mood normal.        Behavior: Behavior normal.     Ortho Exam Examination of her left wrist she has a palpable radial pulse she has some erythema and swelling in the hand into the wrist does not extend past her wrist.  She has movement of her fingers though somewhat painful.  Compartments are soft. Specialty Comments:  No specialty comments available.  Imaging: No results found.   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 No pertinent family history.  Past Surgical History:  Procedure Laterality Date   ABDOMINAL PERINEAL BOWEL RESECTION  2007   COLONOSCOPY  2017   COLONOSCOPY N/A 02/26/2024   Procedure: COLONOSCOPY;  Surgeon: Dianna Specking, MD;  Location: [facility] ENDOSCOPY;  Service: Gastroenterology;  Laterality: N/A;   INCISIONAL HERNIA REPAIR  2008   TONSILLECTOMY     years ago, childhood   TOTAL HIP ARTHROPLASTY Right 08/24/2016   Procedure: RIGHT TOTAL HIP ARTHROPLASTY ANTERIOR APPROACH;  Surgeon: Lonni CINDERELLA Poli, MD;  Location: [facility] ORS;  Service: Orthopedics;  Laterality: Right;   Social History   Occupational History   Not on file  Tobacco Use   Smoking status: Every Day    Current packs/day: 0.75    Average packs/day: 0.8 packs/day for 59.0 years (44.3 ttl pk-yrs)    Types: Cigarettes   Smokeless tobacco: Never  Substance and Sexual Activity   Alcohol use: Yes    Comment: social drinker   Drug use: No   Sexual activity: Never

## 2024-03-28 ENCOUNTER — Other Ambulatory Visit: Payer: Self-pay | Admitting: Orthopedic Surgery

## 2024-03-28 MED ORDER — METHYLPREDNISOLONE 4 MG PO TBPK
ORAL_TABLET | ORAL | 0 refills | Status: AC
Start: 2024-03-28 — End: ?

## 2024-04-08 DIAGNOSIS — Z6822 Body mass index (BMI) 22.0-22.9, adult: Secondary | ICD-10-CM | POA: Diagnosis not present

## 2024-04-08 DIAGNOSIS — M2540 Effusion, unspecified joint: Secondary | ICD-10-CM | POA: Diagnosis not present

## 2024-04-11 DIAGNOSIS — I10 Essential (primary) hypertension: Secondary | ICD-10-CM | POA: Diagnosis not present

## 2024-04-11 DIAGNOSIS — N182 Chronic kidney disease, stage 2 (mild): Secondary | ICD-10-CM | POA: Diagnosis not present

## 2024-04-11 DIAGNOSIS — J432 Centrilobular emphysema: Secondary | ICD-10-CM | POA: Diagnosis not present

## 2024-04-12 DIAGNOSIS — J432 Centrilobular emphysema: Secondary | ICD-10-CM | POA: Diagnosis not present

## 2024-04-12 DIAGNOSIS — E039 Hypothyroidism, unspecified: Secondary | ICD-10-CM | POA: Diagnosis not present

## 2024-04-12 DIAGNOSIS — I10 Essential (primary) hypertension: Secondary | ICD-10-CM | POA: Diagnosis not present

## 2024-04-12 DIAGNOSIS — N182 Chronic kidney disease, stage 2 (mild): Secondary | ICD-10-CM | POA: Diagnosis not present

## 2024-04-15 ENCOUNTER — Ambulatory Visit: Admitting: Physician Assistant

## 2024-04-22 DIAGNOSIS — K529 Noninfective gastroenteritis and colitis, unspecified: Secondary | ICD-10-CM | POA: Diagnosis not present

## 2024-05-06 DIAGNOSIS — M19049 Primary osteoarthritis, unspecified hand: Secondary | ICD-10-CM | POA: Diagnosis not present

## 2024-05-06 DIAGNOSIS — Z6822 Body mass index (BMI) 22.0-22.9, adult: Secondary | ICD-10-CM | POA: Diagnosis not present

## 2024-05-06 DIAGNOSIS — K5120 Ulcerative (chronic) proctitis without complications: Secondary | ICD-10-CM | POA: Diagnosis not present

## 2024-05-11 DIAGNOSIS — N182 Chronic kidney disease, stage 2 (mild): Secondary | ICD-10-CM | POA: Diagnosis not present

## 2024-05-11 DIAGNOSIS — I10 Essential (primary) hypertension: Secondary | ICD-10-CM | POA: Diagnosis not present

## 2024-05-11 DIAGNOSIS — J432 Centrilobular emphysema: Secondary | ICD-10-CM | POA: Diagnosis not present

## 2024-05-12 DIAGNOSIS — J432 Centrilobular emphysema: Secondary | ICD-10-CM | POA: Diagnosis not present

## 2024-05-12 DIAGNOSIS — N182 Chronic kidney disease, stage 2 (mild): Secondary | ICD-10-CM | POA: Diagnosis not present

## 2024-05-12 DIAGNOSIS — E039 Hypothyroidism, unspecified: Secondary | ICD-10-CM | POA: Diagnosis not present

## 2024-05-12 DIAGNOSIS — I10 Essential (primary) hypertension: Secondary | ICD-10-CM | POA: Diagnosis not present

## 2024-05-14 DIAGNOSIS — B399 Histoplasmosis, unspecified: Secondary | ICD-10-CM | POA: Diagnosis not present

## 2024-05-21 ENCOUNTER — Ambulatory Visit (INDEPENDENT_AMBULATORY_CARE_PROVIDER_SITE_OTHER): Admitting: Physician Assistant

## 2024-05-21 ENCOUNTER — Encounter: Payer: Self-pay | Admitting: Physician Assistant

## 2024-05-21 DIAGNOSIS — M25562 Pain in left knee: Secondary | ICD-10-CM | POA: Diagnosis not present

## 2024-05-21 DIAGNOSIS — M25462 Effusion, left knee: Secondary | ICD-10-CM | POA: Insufficient documentation

## 2024-05-21 DIAGNOSIS — G8929 Other chronic pain: Secondary | ICD-10-CM | POA: Diagnosis not present

## 2024-05-21 MED ORDER — LIDOCAINE HCL 1 % IJ SOLN
5.0000 mL | INTRAMUSCULAR | Status: AC | PRN
  Administered 2024-05-21: 5 mL

## 2024-05-21 NOTE — Progress Notes (Signed)
 Office Visit Note   Patient: Darlene Sanders           Date of Birth: 02-19-1946           MRN: [id number] Visit Date: 05/21/2024              Requested by: Aisha Harvey, MD [address] [place],  [place] [address] PCP: Aisha Harvey, MD  Chief Complaint  Patient presents with  . Left Knee - Pain      HPI: [name] is a pleasant 78 year old woman whose had a history recently of left knee effusions.  She was last seen and evaluated by [facility].  I have aspirated her knee once in early August she had a significant recurrent effusion at that time aspiration demonstrated 90 cc of cloudy yellow but not purulent fluid.  It was sent for evaluation.  She comes in today with a reaccumulation of the fluid.  Is making it difficult for her to bear weight denies any fever chills she has a history of ulcerative colitis  Assessment & Plan: Visit Diagnoses:  1. Chronic pain of left knee   2. Effusion, left knee     Plan: Recurrent left knee effusion.  I did aspirate similar fluid without any evidence of infection 120 cc I did also inject Toradol .  At this point I think having an MRI since she has had a recurrent effusion and before earlier this year did not have any problems with her knee would be appropriate.  Still this can be an inflammatory arthropathy if she is also had difficulty with her left wrist.  Will follow-up with her by phone and recommendations after the MRI is completed  Follow-Up Instructions: Return if symptoms worsen or fail to improve.   Ortho Exam  Patient is alert, oriented, no adenopathy, well-dressed, normal affect, normal respiratory effort. Examination of her left knee she has a significant effusion but no redness no erythema no cellulitis compartments are soft and compressible she is neurovascularly intact she is otherwise feeling well    Imaging: No results found. No images are attached to the encounter.  Labs: Lab Results  Component Value Date   REPTSTATUS  08/13/2023 FINAL 08/13/2023   REPTSTATUS 08/15/2023 FINAL 08/13/2023   GRAMSTAIN  08/13/2023    ABUNDANT WBC PRESENT, PREDOMINANTLY PMN FEW GRAM POSITIVE COCCI    CULT  08/13/2023    MODERATE Normal respiratory flora-no Staph aureus or Pseudomonas seen Performed at [facility], [address], [place], [place] [address]      Lab Results  Component Value Date   ALBUMIN 2.4 (L) 02/25/2024   ALBUMIN 2.7 (L) 02/24/2024   ALBUMIN 2.9 (L) 02/23/2024    Lab Results  Component Value Date   MG 1.5 (L) 02/28/2024   MG 1.7 02/27/2024   MG 1.9 02/26/2024   No results found for: VD25OH  No results found for: PREALBUMIN    Latest Ref Rng & Units 02/28/2024    5:35 AM 02/27/2024    5:12 AM 02/26/2024    9:30 PM  CBC EXTENDED  WBC 4.0 - 10.5 K/uL 8.0  8.4  8.7   RBC 3.87 - 5.11 MIL/uL 3.75  3.43  3.45   Hemoglobin 12.0 - 15.0 g/dL 88.7  89.9  89.7   HCT 36.0 - 46.0 % 34.5  31.8  31.4   Platelets 150 - 400 K/uL 404  415  430   NEUT# 1.7 - 7.7 K/uL 5.2  5.4  5.9  Lymph# 0.7 - 4.0 K/uL 1.3  1.5  1.3      There is no height or weight on file to calculate BMI.  Orders:  Orders Placed This Encounter  Procedures  . MR Knee Left w/o contrast   No orders of the defined types were placed in this encounter.    Procedures: Large Joint Inj: L knee on 05/21/2024 4:05 PM Indications: pain, diagnostic evaluation and joint swelling Details: 25 G 1.5 in needle, superolateral approach  Arthrogram: No  Medications: 5 mL lidocaine  1 % Aspirate: yellow Outcome: tolerated well, no immediate complications  After obtaining verbal consent the superior lateral pouch of her left knee was prepped with alcohol and Betadine.  5 cc of lidocaine  was injected.  After adequate analgesia was reprepped with the same.  18-gauge needle was inserted 120 cc of yellow and slightly blood-tinged fluid was aspirated.  30 mg of Toradol  was then injected.  Band-Aid and Ace wrap applied patient ambulated  much better from the clinic Procedure, treatment alternatives, risks and benefits explained, specific risks discussed. Consent was given by the patient.     Clinical Data: No additional findings.  ROS:  All other systems negative, except as noted in the HPI. Review of Systems  Objective: Vital Signs: There were no vitals taken for this visit.  Specialty Comments:  No specialty comments available.  PMFS History: Patient Active Problem List   Diagnosis Date Noted  . Effusion, left knee 05/21/2024  . 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 Rectal bleeding 02/23/2024  . What 01/17/2024  . Acute hypoxic respiratory failure (HCC) 08/13/2023  . History of total hip arthroplasty, right 05/08/2017  . Unilateral primary osteoarthritis, right hip 08/24/2016  . Status post total replacement of right hip 08/24/2016   Past Medical History:  Diagnosis Date  . Arthritis    osteoarthiritis related  . Asthma    many years ago, age 19  . Cancer [facility]) 2012   facial melanoma removal ; MOHs  . Depression   . Diverticulitis   . Headache   . Hypertension   . Hypothyroidism     History reviewed. No pertinent family history.  Past Surgical History:  Procedure Laterality Date  . ABDOMINAL PERINEAL BOWEL RESECTION  2007  . COLONOSCOPY  2017  . COLONOSCOPY N/A 02/26/2024   Procedure: COLONOSCOPY;  Surgeon: Dianna Specking, MD;  Location: [facility] ENDOSCOPY;  Service: Gastroenterology;  Laterality: N/A;  . INCISIONAL HERNIA REPAIR  2008  . TONSILLECTOMY     years ago, childhood  . TOTAL HIP ARTHROPLASTY Right 08/24/2016   Procedure: RIGHT TOTAL HIP ARTHROPLASTY ANTERIOR APPROACH;  Surgeon: Lonni CINDERELLA Poli, MD;  Location: [facility] ORS;  Service: Orthopedics;  Laterality: Right;   Social History   Occupational History  . Not on file  Tobacco Use  . Smoking status: Every Day    Current packs/day: 0.75    Average packs/day: 0.8 packs/day for 59.0 years (44.3 ttl pk-yrs)    Types: Cigarettes  .  Smokeless tobacco: Never  Substance and Sexual Activity  . Alcohol use: Yes    Comment: social drinker  . Drug use: No  . Sexual activity: Never

## 2024-05-27 ENCOUNTER — Encounter: Payer: Self-pay | Admitting: Acute Care

## 2024-06-10 DIAGNOSIS — N182 Chronic kidney disease, stage 2 (mild): Secondary | ICD-10-CM | POA: Diagnosis not present

## 2024-06-10 DIAGNOSIS — J432 Centrilobular emphysema: Secondary | ICD-10-CM | POA: Diagnosis not present

## 2024-06-10 DIAGNOSIS — I10 Essential (primary) hypertension: Secondary | ICD-10-CM | POA: Diagnosis not present

## 2024-06-12 DIAGNOSIS — I10 Essential (primary) hypertension: Secondary | ICD-10-CM | POA: Diagnosis not present

## 2024-06-12 DIAGNOSIS — E039 Hypothyroidism, unspecified: Secondary | ICD-10-CM | POA: Diagnosis not present

## 2024-06-12 DIAGNOSIS — N182 Chronic kidney disease, stage 2 (mild): Secondary | ICD-10-CM | POA: Diagnosis not present

## 2024-06-12 DIAGNOSIS — J432 Centrilobular emphysema: Secondary | ICD-10-CM | POA: Diagnosis not present

## 2024-06-13 ENCOUNTER — Ambulatory Visit
Admission: RE | Admit: 2024-06-13 | Discharge: 2024-06-13 | Disposition: A | Source: Ambulatory Visit | Attending: Physician Assistant

## 2024-06-13 ENCOUNTER — Other Ambulatory Visit

## 2024-06-13 DIAGNOSIS — G8929 Other chronic pain: Secondary | ICD-10-CM

## 2024-06-15 ENCOUNTER — Encounter: Payer: Self-pay | Admitting: Radiology

## 2024-06-15 DIAGNOSIS — M23322 Other meniscus derangements, posterior horn of medial meniscus, left knee: Secondary | ICD-10-CM | POA: Diagnosis not present

## 2024-06-17 DIAGNOSIS — E538 Deficiency of other specified B group vitamins: Secondary | ICD-10-CM | POA: Diagnosis not present

## 2024-06-17 DIAGNOSIS — R801 Persistent proteinuria, unspecified: Secondary | ICD-10-CM | POA: Diagnosis not present

## 2024-06-17 DIAGNOSIS — R7303 Prediabetes: Secondary | ICD-10-CM | POA: Diagnosis not present

## 2024-06-17 DIAGNOSIS — E039 Hypothyroidism, unspecified: Secondary | ICD-10-CM | POA: Diagnosis not present

## 2024-06-22 DIAGNOSIS — E039 Hypothyroidism, unspecified: Secondary | ICD-10-CM | POA: Diagnosis not present

## 2024-06-22 DIAGNOSIS — E538 Deficiency of other specified B group vitamins: Secondary | ICD-10-CM | POA: Diagnosis not present

## 2024-06-22 DIAGNOSIS — R7303 Prediabetes: Secondary | ICD-10-CM | POA: Diagnosis not present

## 2024-06-22 DIAGNOSIS — M2540 Effusion, unspecified joint: Secondary | ICD-10-CM | POA: Diagnosis not present

## 2024-06-22 DIAGNOSIS — R801 Persistent proteinuria, unspecified: Secondary | ICD-10-CM | POA: Diagnosis not present

## 2024-06-22 DIAGNOSIS — I10 Essential (primary) hypertension: Secondary | ICD-10-CM | POA: Diagnosis not present

## 2024-06-22 DIAGNOSIS — E782 Mixed hyperlipidemia: Secondary | ICD-10-CM | POA: Diagnosis not present

## 2024-06-22 DIAGNOSIS — N182 Chronic kidney disease, stage 2 (mild): Secondary | ICD-10-CM | POA: Diagnosis not present

## 2024-06-22 DIAGNOSIS — F331 Major depressive disorder, recurrent, moderate: Secondary | ICD-10-CM | POA: Diagnosis not present

## 2024-06-22 DIAGNOSIS — S83207D Unspecified tear of unspecified meniscus, current injury, left knee, subsequent encounter: Secondary | ICD-10-CM | POA: Diagnosis not present

## 2024-06-22 DIAGNOSIS — Z23 Encounter for immunization: Secondary | ICD-10-CM | POA: Diagnosis not present

## 2024-06-22 DIAGNOSIS — K5120 Ulcerative (chronic) proctitis without complications: Secondary | ICD-10-CM | POA: Diagnosis not present

## 2024-07-02 ENCOUNTER — Other Ambulatory Visit (HOSPITAL_COMMUNITY): Payer: Self-pay

## 2024-07-07 DIAGNOSIS — K648 Other hemorrhoids: Secondary | ICD-10-CM | POA: Diagnosis not present

## 2024-07-07 DIAGNOSIS — K921 Melena: Secondary | ICD-10-CM | POA: Diagnosis not present

## 2024-07-07 DIAGNOSIS — K51211 Ulcerative (chronic) proctitis with rectal bleeding: Secondary | ICD-10-CM | POA: Diagnosis not present

## 2024-07-08 DIAGNOSIS — Z1231 Encounter for screening mammogram for malignant neoplasm of breast: Secondary | ICD-10-CM | POA: Diagnosis not present

## 2024-07-10 DIAGNOSIS — J432 Centrilobular emphysema: Secondary | ICD-10-CM | POA: Diagnosis not present

## 2024-07-10 DIAGNOSIS — I10 Essential (primary) hypertension: Secondary | ICD-10-CM | POA: Diagnosis not present

## 2024-07-10 DIAGNOSIS — N182 Chronic kidney disease, stage 2 (mild): Secondary | ICD-10-CM | POA: Diagnosis not present

## 2024-07-12 DIAGNOSIS — I10 Essential (primary) hypertension: Secondary | ICD-10-CM | POA: Diagnosis not present

## 2024-07-12 DIAGNOSIS — J432 Centrilobular emphysema: Secondary | ICD-10-CM | POA: Diagnosis not present

## 2024-07-12 DIAGNOSIS — N182 Chronic kidney disease, stage 2 (mild): Secondary | ICD-10-CM | POA: Diagnosis not present

## 2024-07-12 DIAGNOSIS — E039 Hypothyroidism, unspecified: Secondary | ICD-10-CM | POA: Diagnosis not present

## 2024-07-15 ENCOUNTER — Encounter: Payer: Self-pay | Admitting: Orthopaedic Surgery

## 2024-07-15 ENCOUNTER — Ambulatory Visit: Admitting: Orthopaedic Surgery

## 2024-07-15 DIAGNOSIS — S83272D Complex tear of lateral meniscus, current injury, left knee, subsequent encounter: Secondary | ICD-10-CM | POA: Diagnosis not present

## 2024-07-15 DIAGNOSIS — S83232D Complex tear of medial meniscus, current injury, left knee, subsequent encounter: Secondary | ICD-10-CM | POA: Diagnosis not present

## 2024-07-15 NOTE — Progress Notes (Signed)
 The patient is an active 78 year old female that we are seeing for follow-up after she had an MRI of her left knee that was ordered by one of the other providers which was appropriate to order.  She has been having knee swelling with locking and catching.  Last time she had an aspiration and a steroid injection in that knee was 2 months ago on the left knee and she says since then she is doing great.  She has had no further issues with her left knee.  Her x-rays of the left knee show well-maintained joint space so at the time was appropriate to order the MRI of her left knee.  She has been out blowing leaves and the yard and doing other activities without any issues with her left knee.  On exam today her left knee is basically asymptomatic.  There is no effusion and with excellent range of motion with a negative McMurray's and negative Lachman's exam.  The MRI of her left knee does show complex tearing of the medial and lateral meniscus as well as a medial plica with only slight thinning of the articular cartilage of her knee.  The tears of the medial and lateral meniscus are complex and not degenerative in nature.  I showed her knee model we talked about the potential of arthroscopic surgery of her left knee if it becomes asymptomatic from her meniscal tears.  Occasionally she does get a catch in her knee but not true locking.  Right now she would like to hold off on any surgical interventions and she is doing well and I agree with this as well.  Also she is dealing with a flareup of ulcerative colitis and has had some rectal bleeding and is seeing her GI physician for this.  She would like to have her GI issues taken care of before considering any type of arthroscopic surgery on her left knee which I think is also absolutely reasonable.  We did talk about the possibility of trying a copper fit knee sleeve which could help give her some compression and support of her knee.  If things worsen she knows to reach  out and let us  know.

## 2024-07-20 DIAGNOSIS — K5120 Ulcerative (chronic) proctitis without complications: Secondary | ICD-10-CM | POA: Diagnosis not present
